# Patient Record
Sex: Female | Born: 1985 | Race: Black or African American | Hispanic: No | Marital: Single | Smoking: Never smoker
Health system: Southern US, Community
[De-identification: ages and names within clinical notes are randomized; demographics above are authoritative.]

## PROBLEM LIST (undated history)

## (undated) ENCOUNTER — Inpatient Hospital Stay (HOSPITAL_COMMUNITY): Payer: Self-pay

## (undated) DIAGNOSIS — D649 Anemia, unspecified: Secondary | ICD-10-CM

## (undated) DIAGNOSIS — L709 Acne, unspecified: Secondary | ICD-10-CM

## (undated) DIAGNOSIS — R519 Headache, unspecified: Secondary | ICD-10-CM

## (undated) DIAGNOSIS — N390 Urinary tract infection, site not specified: Secondary | ICD-10-CM

## (undated) DIAGNOSIS — R011 Cardiac murmur, unspecified: Secondary | ICD-10-CM

## (undated) HISTORY — PX: NO PAST SURGERIES: SHX2092

---

## 2005-12-27 ENCOUNTER — Emergency Department (HOSPITAL_COMMUNITY): Admission: EM | Admit: 2005-12-27 | Discharge: 2005-12-27 | Payer: Self-pay | Admitting: Emergency Medicine

## 2009-06-02 ENCOUNTER — Emergency Department (HOSPITAL_COMMUNITY): Admission: EM | Admit: 2009-06-02 | Discharge: 2009-06-03 | Payer: Self-pay | Admitting: Emergency Medicine

## 2009-06-05 ENCOUNTER — Observation Stay (HOSPITAL_COMMUNITY): Admission: AD | Admit: 2009-06-05 | Discharge: 2009-06-06 | Payer: Self-pay | Admitting: Obstetrics

## 2009-06-05 ENCOUNTER — Encounter: Payer: Self-pay | Admitting: Emergency Medicine

## 2009-06-10 ENCOUNTER — Inpatient Hospital Stay (HOSPITAL_COMMUNITY): Admission: AD | Admit: 2009-06-10 | Discharge: 2009-06-10 | Payer: Self-pay | Admitting: Obstetrics & Gynecology

## 2009-06-10 ENCOUNTER — Ambulatory Visit: Payer: Self-pay | Admitting: Advanced Practice Midwife

## 2010-07-20 LAB — HEPATIC FUNCTION PANEL
ALT: 12 U/L (ref 0–35)
AST: 16 U/L (ref 0–37)
Bilirubin, Direct: 0.1 mg/dL (ref 0.0–0.3)
Total Protein: 6.8 g/dL (ref 6.0–8.3)

## 2010-07-20 LAB — URINALYSIS, ROUTINE W REFLEX MICROSCOPIC
Bilirubin Urine: NEGATIVE
Bilirubin Urine: NEGATIVE
Hgb urine dipstick: NEGATIVE
Ketones, ur: 40 mg/dL — AB
Leukocytes, UA: NEGATIVE
Nitrite: NEGATIVE
Protein, ur: NEGATIVE mg/dL
Protein, ur: NEGATIVE mg/dL
Specific Gravity, Urine: 1.036 — ABNORMAL HIGH (ref 1.005–1.030)
Urobilinogen, UA: 1 mg/dL (ref 0.0–1.0)
Urobilinogen, UA: 1 mg/dL (ref 0.0–1.0)
pH: 7.5 (ref 5.0–8.0)

## 2010-07-20 LAB — POCT I-STAT, CHEM 8
BUN: 10 mg/dL (ref 6–23)
BUN: 6 mg/dL (ref 6–23)
Calcium, Ion: 1.15 mmol/L (ref 1.12–1.32)
Calcium, Ion: 1.16 mmol/L (ref 1.12–1.32)
Chloride: 106 mEq/L (ref 96–112)
Chloride: 107 mEq/L (ref 96–112)
Creatinine, Ser: 0.7 mg/dL (ref 0.4–1.2)
Creatinine, Ser: 0.9 mg/dL (ref 0.4–1.2)
HCT: 38 % (ref 36.0–46.0)
HCT: 45 % (ref 36.0–46.0)
Hemoglobin: 12.9 g/dL (ref 12.0–15.0)
Potassium: 3.7 mEq/L (ref 3.5–5.1)
TCO2: 23 mmol/L (ref 0–100)

## 2010-07-20 LAB — HCG, QUANTITATIVE, PREGNANCY: hCG, Beta Chain, Quant, S: 48148 m[IU]/mL — ABNORMAL HIGH (ref <5)

## 2010-07-20 LAB — URINE MICROSCOPIC-ADD ON

## 2010-07-20 LAB — URINE CULTURE: Colony Count: 25000

## 2010-07-20 LAB — WET PREP, GENITAL: WBC, Wet Prep HPF POC: NONE SEEN

## 2010-07-20 LAB — GC/CHLAMYDIA PROBE AMP, GENITAL
Chlamydia, DNA Probe: NEGATIVE
GC Probe Amp, Genital: NEGATIVE

## 2010-07-21 LAB — URINALYSIS, ROUTINE W REFLEX MICROSCOPIC
Bilirubin Urine: NEGATIVE
Glucose, UA: NEGATIVE mg/dL
Glucose, UA: NEGATIVE mg/dL
Ketones, ur: 15 mg/dL — AB
Nitrite: NEGATIVE
Nitrite: NEGATIVE
Protein, ur: NEGATIVE mg/dL
Urobilinogen, UA: 0.2 mg/dL (ref 0.0–1.0)
pH: 6 (ref 5.0–8.0)
pH: 8 (ref 5.0–8.0)

## 2010-07-21 LAB — COMPREHENSIVE METABOLIC PANEL
AST: 17 U/L (ref 0–37)
Alkaline Phosphatase: 43 U/L (ref 39–117)
BUN: 4 mg/dL — ABNORMAL LOW (ref 6–23)
CO2: 22 mEq/L (ref 19–32)
Calcium: 8.4 mg/dL (ref 8.4–10.5)
Chloride: 109 mEq/L (ref 96–112)
Creatinine, Ser: 0.69 mg/dL (ref 0.4–1.2)
GFR calc Af Amer: >60 mL/min (ref >60)
GFR calc non Af Amer: >60 mL/min (ref >60)
Glucose, Bld: 83 mg/dL (ref 70–99)
Potassium: 3.8 mEq/L (ref 3.5–5.1)

## 2010-07-21 LAB — CBC
HCT: 39.2 % (ref 36.0–46.0)
MCV: 88.4 fL (ref 78.0–100.0)
RBC: 4.44 MIL/uL (ref 3.87–5.11)
RDW: 13.2 % (ref 11.5–15.5)

## 2010-07-21 LAB — RAPID URINE DRUG SCREEN, HOSP PERFORMED
Amphetamines: NOT DETECTED
Cocaine: NOT DETECTED
Tetrahydrocannabinol: NOT DETECTED

## 2010-07-21 LAB — HEPATITIS A ANTIBODY, TOTAL

## 2010-07-21 LAB — DIFFERENTIAL
Basophils Relative: 0 % (ref 0–1)
Lymphocytes Relative: 15 % (ref 12–46)
Neutro Abs: 5.8 10*3/uL (ref 1.7–7.7)

## 2010-09-10 ENCOUNTER — Inpatient Hospital Stay (HOSPITAL_COMMUNITY)
Admission: AD | Admit: 2010-09-10 | Discharge: 2010-09-11 | Disposition: A | Discharge status: Home / Self Care | Payer: Medicaid Other | Source: Ambulatory Visit | Attending: Obstetrics & Gynecology | Admitting: Obstetrics & Gynecology

## 2010-09-10 DIAGNOSIS — O210 Mild hyperemesis gravidarum: Secondary | ICD-10-CM | POA: Insufficient documentation

## 2010-09-10 LAB — CBC
HCT: 40.1 % (ref 36.0–46.0)
Hemoglobin: 13.3 g/dL (ref 12.0–15.0)
MCH: 28.1 pg (ref 26.0–34.0)
Platelets: 287 10*3/uL (ref 150–400)
RBC: 4.73 MIL/uL (ref 3.87–5.11)
RDW: 13.6 % (ref 11.5–15.5)

## 2010-09-10 LAB — URINALYSIS, ROUTINE W REFLEX MICROSCOPIC
Glucose, UA: NEGATIVE mg/dL
Hgb urine dipstick: NEGATIVE
Ketones, ur: 40 mg/dL — AB
Nitrite: NEGATIVE
Protein, ur: NEGATIVE mg/dL
Specific Gravity, Urine: >1.03 — ABNORMAL HIGH (ref 1.005–1.030)
Urobilinogen, UA: 0.2 mg/dL (ref 0.0–1.0)
pH: 6 (ref 5.0–8.0)

## 2010-09-11 LAB — COMPREHENSIVE METABOLIC PANEL
ALT: 14 U/L (ref 0–35)
AST: 18 U/L (ref 0–37)
Albumin: 3.9 g/dL (ref 3.5–5.2)
Alkaline Phosphatase: 65 U/L (ref 39–117)
BUN: 8 mg/dL (ref 6–23)
Calcium: 9.5 mg/dL (ref 8.4–10.5)
Creatinine, Ser: 0.75 mg/dL (ref 0.4–1.2)
Glucose, Bld: 97 mg/dL (ref 70–99)
Potassium: 3.6 mEq/L (ref 3.5–5.1)
Total Bilirubin: 0.3 mg/dL (ref 0.3–1.2)

## 2010-09-14 ENCOUNTER — Inpatient Hospital Stay (HOSPITAL_COMMUNITY): Payer: Medicaid Other

## 2010-09-14 ENCOUNTER — Inpatient Hospital Stay (HOSPITAL_COMMUNITY)
Admission: AD | Admit: 2010-09-14 | Discharge: 2010-09-14 | Disposition: A | Discharge status: Home / Self Care | Payer: Medicaid Other | Source: Ambulatory Visit | Attending: Obstetrics and Gynecology | Admitting: Obstetrics and Gynecology

## 2010-09-14 DIAGNOSIS — A499 Bacterial infection, unspecified: Secondary | ICD-10-CM | POA: Insufficient documentation

## 2010-09-14 DIAGNOSIS — O2390 Unspecified genitourinary tract infection in pregnancy, unspecified trimester: Secondary | ICD-10-CM | POA: Insufficient documentation

## 2010-09-14 DIAGNOSIS — O210 Mild hyperemesis gravidarum: Secondary | ICD-10-CM | POA: Insufficient documentation

## 2010-09-14 DIAGNOSIS — B9689 Other specified bacterial agents as the cause of diseases classified elsewhere: Secondary | ICD-10-CM | POA: Insufficient documentation

## 2010-09-14 DIAGNOSIS — N760 Acute vaginitis: Secondary | ICD-10-CM | POA: Insufficient documentation

## 2010-09-14 LAB — URINALYSIS, ROUTINE W REFLEX MICROSCOPIC
Glucose, UA: NEGATIVE mg/dL
Nitrite: NEGATIVE
Urobilinogen, UA: 2 mg/dL — ABNORMAL HIGH (ref 0.0–1.0)
pH: 6.5 (ref 5.0–8.0)

## 2010-09-14 LAB — CBC
MCV: 84.3 fL (ref 78.0–100.0)
Platelets: 297 10*3/uL (ref 150–400)
RDW: 13.3 % (ref 11.5–15.5)
WBC: 7.4 10*3/uL (ref 4.0–10.5)

## 2010-09-14 LAB — URINE MICROSCOPIC-ADD ON

## 2010-09-14 LAB — COMPREHENSIVE METABOLIC PANEL
ALT: 10 U/L (ref 0–35)
Alkaline Phosphatase: 58 U/L (ref 39–117)
CO2: 22 mEq/L (ref 19–32)
Chloride: 101 mEq/L (ref 96–112)
GFR calc non Af Amer: >60 mL/min (ref >60)
Glucose, Bld: 97 mg/dL (ref 70–99)
Potassium: 3.7 mEq/L (ref 3.5–5.1)
Sodium: 136 mEq/L (ref 135–145)

## 2010-09-14 LAB — HCG, QUANTITATIVE, PREGNANCY: hCG, Beta Chain, Quant, S: 57208 m[IU]/mL — ABNORMAL HIGH (ref <5)

## 2010-09-15 LAB — GC/CHLAMYDIA PROBE AMP, GENITAL: GC Probe Amp, Genital: NEGATIVE

## 2010-09-26 ENCOUNTER — Inpatient Hospital Stay (HOSPITAL_COMMUNITY)
Admission: EM | Admit: 2010-09-26 | Discharge: 2010-09-27 | Disposition: A | Discharge status: Home / Self Care | Payer: Medicaid Other | Source: Ambulatory Visit | Attending: Obstetrics and Gynecology | Admitting: Obstetrics and Gynecology

## 2010-09-26 DIAGNOSIS — O210 Mild hyperemesis gravidarum: Secondary | ICD-10-CM | POA: Insufficient documentation

## 2010-09-27 LAB — URINALYSIS, ROUTINE W REFLEX MICROSCOPIC
Bilirubin Urine: NEGATIVE
Hgb urine dipstick: NEGATIVE
Specific Gravity, Urine: 1.01 (ref 1.005–1.030)
Urobilinogen, UA: 0.2 mg/dL (ref 0.0–1.0)

## 2010-10-06 ENCOUNTER — Inpatient Hospital Stay (HOSPITAL_COMMUNITY)
Admission: AD | Admit: 2010-10-06 | Discharge: 2010-10-07 | Disposition: A | Discharge status: Home / Self Care | Payer: Medicaid Other | Source: Ambulatory Visit | Attending: Obstetrics & Gynecology | Admitting: Obstetrics & Gynecology

## 2010-10-06 DIAGNOSIS — O210 Mild hyperemesis gravidarum: Secondary | ICD-10-CM

## 2010-10-06 LAB — URINALYSIS, ROUTINE W REFLEX MICROSCOPIC
Glucose, UA: NEGATIVE mg/dL
Hgb urine dipstick: NEGATIVE
Protein, ur: NEGATIVE mg/dL
Specific Gravity, Urine: 1.015 (ref 1.005–1.030)
pH: 7.5 (ref 5.0–8.0)

## 2010-10-11 ENCOUNTER — Inpatient Hospital Stay (HOSPITAL_COMMUNITY)
Admission: AD | Admit: 2010-10-11 | Discharge: 2010-10-12 | Disposition: A | Discharge status: Home / Self Care | Payer: Medicaid Other | Source: Ambulatory Visit | Attending: Obstetrics & Gynecology | Admitting: Obstetrics & Gynecology

## 2010-10-11 DIAGNOSIS — O210 Mild hyperemesis gravidarum: Secondary | ICD-10-CM | POA: Insufficient documentation

## 2010-10-11 LAB — URINE MICROSCOPIC-ADD ON

## 2010-10-11 LAB — URINALYSIS, ROUTINE W REFLEX MICROSCOPIC
Glucose, UA: NEGATIVE mg/dL
Leukocytes, UA: NEGATIVE
Specific Gravity, Urine: >1.03 — ABNORMAL HIGH (ref 1.005–1.030)
pH: 6 (ref 5.0–8.0)

## 2010-10-18 ENCOUNTER — Other Ambulatory Visit (HOSPITAL_COMMUNITY): Payer: Self-pay | Admitting: Maternal and Fetal Medicine

## 2010-10-18 ENCOUNTER — Other Ambulatory Visit (HOSPITAL_COMMUNITY): Payer: Self-pay | Admitting: Family Medicine

## 2010-10-18 DIAGNOSIS — Z369 Encounter for antenatal screening, unspecified: Secondary | ICD-10-CM

## 2010-10-24 LAB — HIV ANTIBODY (ROUTINE TESTING W REFLEX): HIV: NONREACTIVE

## 2010-10-28 ENCOUNTER — Inpatient Hospital Stay (HOSPITAL_COMMUNITY)
Admission: AD | Admit: 2010-10-28 | Discharge: 2010-10-28 | Disposition: A | Discharge status: Home / Self Care | Payer: Medicaid Other | Source: Ambulatory Visit | Attending: Family Medicine | Admitting: Family Medicine

## 2010-10-28 DIAGNOSIS — O210 Mild hyperemesis gravidarum: Secondary | ICD-10-CM

## 2010-10-28 LAB — CBC
HCT: 37.3 % (ref 36.0–46.0)
MCV: 83.6 fL (ref 78.0–100.0)
Platelets: 310 10*3/uL (ref 150–400)
RBC: 4.46 MIL/uL (ref 3.87–5.11)
WBC: 10.4 10*3/uL (ref 4.0–10.5)

## 2010-10-28 LAB — URINALYSIS, ROUTINE W REFLEX MICROSCOPIC
Bilirubin Urine: NEGATIVE
Glucose, UA: NEGATIVE mg/dL
Hgb urine dipstick: NEGATIVE
Protein, ur: 30 mg/dL — AB
Urobilinogen, UA: 0.2 mg/dL (ref 0.0–1.0)

## 2010-10-28 LAB — BASIC METABOLIC PANEL
BUN: 8 mg/dL (ref 6–23)
CO2: 21 mEq/L (ref 19–32)
Chloride: 100 mEq/L (ref 96–112)
Creatinine, Ser: 0.55 mg/dL (ref 0.50–1.10)
GFR calc Af Amer: >60 mL/min (ref >60)
Potassium: 3.7 mEq/L (ref 3.5–5.1)

## 2010-10-28 LAB — URINE MICROSCOPIC-ADD ON

## 2010-12-14 ENCOUNTER — Ambulatory Visit (HOSPITAL_COMMUNITY)
Admission: RE | Admit: 2010-12-14 | Discharge: 2010-12-14 | Disposition: A | Discharge status: Home / Self Care | Payer: Medicaid Other | Source: Ambulatory Visit | Attending: Obstetrics and Gynecology | Admitting: Obstetrics and Gynecology

## 2010-12-14 ENCOUNTER — Encounter (HOSPITAL_COMMUNITY): Payer: Self-pay

## 2010-12-14 NOTE — Progress Notes (Signed)
Genetic Counseling  High-Risk Gestation Note  Appointment Date:  12/14/2010 Referred By: Fortino Sic, MD Date of Birth:  October 04, 1985 Partner:  Drucie Ip    Pregnancy History: [id number] Estimated Date of Delivery: 05/16/10 Estimated Gestational Age: 26.6 weeks  I met with Ms. Towery today for genetic counseling regarding her hemoglobin electrophoresis results.  Ms. Brucks was offered hemoglobin electrophoresis through her primary obstetrician's office for screening of hemoglobinopathies, because of her African-American ancestry.  Her result showed levels of hemoglobin A and hemoglobin A2, which were just outside of the normal range.  For hemoglobin A, the normal range is 96.8-97.8% and Ms. Holdsworth's percentage was 96.7.  In addition, her hemoglobin A2 was 3.3% as compared to the normal range of 2.2-3.2%.  We discussed that elevated A2 levels can be seen in beta thalassemia trait as well as a variety of other unstable hemoglobin variants; however, the range of hemoglobin A2 in carriers of beta thalassemia trait is usually 4-9% and these individuals typically have a low level of hemoglobin F, 1-5%.  Ms. Phegley hemoglobin electrophoresis result showed 0% hemoglobin F.  No other hemoglobin variants (Hb S, Hb C etc) were apparent by her test result.  We also discussed the results of Ms. Malizia's CBC.  Specifically, we discussed that her RBC values were within normal limits.  In this scenario, it is unlikely that this mild/borderline elevation of hemoglobin A2 is of any clinical significance.    Ms. Berthelot was counseled that the thalassemias are the most common human single-gene disorders affecting hemoglobin synthesis.  Mutations in these genes reduce the synthesis or stability of either the alpha globin or beta globin chain to cause alpha thalassemia or beta thalassemia, respectively.  The resulting imbalance in the ratio of the alpha chains to the beta chains underlies the pathophysiological  process.  The chain produced at the normal rate is in relative excess.  In the absence of the complementary chain with which to form a tetramer, the excess normal chains eventually precipitate in the cell, damaging the cell membrane and leading to premature red blood cell destruction (anemia).  We reviewed the autosomal recessive inheritance of beta thalassemia and the associated risks.  The FOB, Mr. Drucie Ip, had hemoglobin electrophoresis and his results were within normal limits.  Given this result, Ms. Nordin was counseled that the fetus is not expected to have an increased risk for a hemoglobinopathy.  We reviewed that hemoglobinopathies are routinely screened for as part of the [place] newborn screening panel.  Ms. Frick understands that while it is most likely that her result is not clinically significant, further evaluation could help to clarify this result.  We reviewed the options of molecular genetic testing of the beta globin gene to determine if there is a specific gene alteration as well as the option of repeating the hemoglobin electrophoresis.  She declined these options today.  Both family histories were reviewed and found to be noncontributory for birth defects, mental retardation, and known genetic conditions.  Without further information regarding the provided family history, an accurate genetic risk cannot be calculated.   Further genetic counseling is warranted if more information is obtained.  The patient denied exposure to environmental toxins or chemical agents.  She denied the use of alcohol, tobacco or street drugs.  She denied significant viral illnesses during the course of her pregnancy.  Her medical and surgical history were noncontributory.   I counseled the patient for approximately 35 minutes regarding the above risks and available  options.     Duffy Rhody, Florencia Zaccaro 12/14/2010

## 2011-04-12 LAB — STREP B DNA PROBE: GBS: NEGATIVE

## 2011-05-09 ENCOUNTER — Encounter (HOSPITAL_COMMUNITY): Payer: Self-pay | Admitting: Anesthesiology

## 2011-05-09 ENCOUNTER — Inpatient Hospital Stay (HOSPITAL_COMMUNITY)
Admission: RE | Admit: 2011-05-09 | Discharge: 2011-05-12 | DRG: 775 | Disposition: A | Discharge status: Home / Self Care | Payer: Medicaid Other | Source: Ambulatory Visit | Attending: Obstetrics and Gynecology | Admitting: Obstetrics and Gynecology

## 2011-05-09 ENCOUNTER — Inpatient Hospital Stay (HOSPITAL_COMMUNITY): Payer: Medicaid Other | Admitting: Anesthesiology

## 2011-05-09 ENCOUNTER — Encounter (HOSPITAL_COMMUNITY): Payer: Self-pay

## 2011-05-09 ENCOUNTER — Other Ambulatory Visit (HOSPITAL_COMMUNITY): Payer: Self-pay | Admitting: Obstetrics and Gynecology

## 2011-05-09 DIAGNOSIS — O480 Post-term pregnancy: Secondary | ICD-10-CM | POA: Diagnosis present

## 2011-05-09 DIAGNOSIS — D649 Anemia, unspecified: Secondary | ICD-10-CM | POA: Diagnosis not present

## 2011-05-09 DIAGNOSIS — O9903 Anemia complicating the puerperium: Secondary | ICD-10-CM | POA: Diagnosis not present

## 2011-05-09 DIAGNOSIS — O139 Gestational [pregnancy-induced] hypertension without significant proteinuria, unspecified trimester: Principal | ICD-10-CM | POA: Diagnosis present

## 2011-05-09 HISTORY — DX: Cardiac murmur, unspecified: R01.1

## 2011-05-09 HISTORY — DX: Anemia, unspecified: D64.9

## 2011-05-09 LAB — CBC
HCT: 29.9 % — ABNORMAL LOW (ref 36.0–46.0)
MCH: 26.6 pg (ref 26.0–34.0)
MCHC: 32.4 g/dL (ref 30.0–36.0)
MCV: 82.1 fL (ref 78.0–100.0)
Platelets: 261 10*3/uL (ref 150–400)
RDW: 14.2 % (ref 11.5–15.5)

## 2011-05-09 MED ORDER — FENTANYL 2.5 MCG/ML BUPIVACAINE 1/10 % EPIDURAL INFUSION (WH - ANES)
14.0000 mL/h | INTRAMUSCULAR | Status: DC
  Administered 2011-05-09 – 2011-05-10 (×6): 14 mL/h via EPIDURAL
  Filled 2011-05-09 (×8): qty 60

## 2011-05-09 MED ORDER — OXYTOCIN BOLUS FROM INFUSION
500.0000 mL | Freq: Once | INTRAVENOUS | Status: DC
  Filled 2011-05-09: qty 500

## 2011-05-09 MED ORDER — FENTANYL 2.5 MCG/ML BUPIVACAINE 1/10 % EPIDURAL INFUSION (WH - ANES)
INTRAMUSCULAR | Status: DC | PRN
  Administered 2011-05-09: 14 mL/h via EPIDURAL

## 2011-05-09 MED ORDER — IBUPROFEN 600 MG PO TABS: 600.0000 mg | ORAL_TABLET | Freq: Four times a day (QID) | ORAL | Status: DC | PRN

## 2011-05-09 MED ORDER — CITRIC ACID-SODIUM CITRATE 334-500 MG/5ML PO SOLN: 30.0000 mL | ORAL | Status: DC | PRN

## 2011-05-09 MED ORDER — TERBUTALINE SULFATE 1 MG/ML IJ SOLN: 0.2500 mg | Freq: Once | INTRAMUSCULAR | Status: AC | PRN

## 2011-05-09 MED ORDER — OXYTOCIN 20 UNITS IN LACTATED RINGERS INFUSION - SIMPLE
125.0000 mL/h | INTRAVENOUS | Status: DC
  Administered 2011-05-09: 2 m[IU]/min via INTRAVENOUS
  Administered 2011-05-09: 8 m[IU]/min via INTRAVENOUS
  Administered 2011-05-10: 125 mL/h via INTRAVENOUS
  Filled 2011-05-09: qty 1000

## 2011-05-09 MED ORDER — EPHEDRINE 5 MG/ML INJ
10.0000 mg | INTRAVENOUS | Status: DC | PRN
  Filled 2011-05-09: qty 4

## 2011-05-09 MED ORDER — LIDOCAINE HCL (PF) 1 % IJ SOLN
30.0000 mL | INTRAMUSCULAR | Status: DC | PRN
  Filled 2011-05-09: qty 30

## 2011-05-09 MED ORDER — DIPHENHYDRAMINE HCL 50 MG/ML IJ SOLN: 12.5000 mg | INTRAMUSCULAR | Status: DC | PRN

## 2011-05-09 MED ORDER — OXYTOCIN 20 UNITS IN LACTATED RINGERS INFUSION - SIMPLE
125.0000 mL/h | Freq: Once | INTRAVENOUS | Status: DC
  Filled 2011-05-09: qty 1000

## 2011-05-09 MED ORDER — ONDANSETRON HCL 4 MG/2ML IJ SOLN
4.0000 mg | Freq: Four times a day (QID) | INTRAMUSCULAR | Status: DC | PRN
  Administered 2011-05-09: 4 mg via INTRAVENOUS
  Filled 2011-05-09: qty 2

## 2011-05-09 MED ORDER — LACTATED RINGERS IV SOLN
INTRAVENOUS | Status: DC
  Administered 2011-05-09 – 2011-05-10 (×4): via INTRAVENOUS
  Administered 2011-05-10: 300 mL via INTRAVENOUS
  Administered 2011-05-10: 11:00:00 via INTRAVENOUS

## 2011-05-09 MED ORDER — PHENYLEPHRINE 40 MCG/ML (10ML) SYRINGE FOR IV PUSH (FOR BLOOD PRESSURE SUPPORT): 80.0000 ug | PREFILLED_SYRINGE | INTRAVENOUS | Status: DC | PRN

## 2011-05-09 MED ORDER — FLEET ENEMA 7-19 GM/118ML RE ENEM: 1.0000 | ENEMA | RECTAL | Status: DC | PRN

## 2011-05-09 MED ORDER — EPHEDRINE 5 MG/ML INJ: 10.0000 mg | INTRAVENOUS | Status: DC | PRN

## 2011-05-09 MED ORDER — LIDOCAINE HCL 1.5 % IJ SOLN
INTRAMUSCULAR | Status: DC | PRN
  Administered 2011-05-09 (×2): 4 mL via EPIDURAL

## 2011-05-09 MED ORDER — ACETAMINOPHEN 325 MG PO TABS
650.0000 mg | ORAL_TABLET | ORAL | Status: DC | PRN
  Administered 2011-05-10: 650 mg via ORAL
  Filled 2011-05-09: qty 2

## 2011-05-09 MED ORDER — OXYCODONE-ACETAMINOPHEN 5-325 MG PO TABS: 2.0000 | ORAL_TABLET | ORAL | Status: DC | PRN

## 2011-05-09 MED ORDER — LACTATED RINGERS IV SOLN: 500.0000 mL | Freq: Once | INTRAVENOUS | Status: DC

## 2011-05-09 MED ORDER — LACTATED RINGERS IV SOLN
500.0000 mL | INTRAVENOUS | Status: DC | PRN
  Administered 2011-05-09: 300 mL via INTRAVENOUS
  Administered 2011-05-09: 1000 mL via INTRAVENOUS

## 2011-05-09 MED ORDER — PHENYLEPHRINE 40 MCG/ML (10ML) SYRINGE FOR IV PUSH (FOR BLOOD PRESSURE SUPPORT)
80.0000 ug | PREFILLED_SYRINGE | INTRAVENOUS | Status: DC | PRN
  Filled 2011-05-09: qty 5

## 2011-05-09 NOTE — Anesthesia Preprocedure Evaluation (Signed)
Anesthesia Evaluation  Patient identified by MRN, date of birth, ID band Patient awake    Reviewed: Allergy & Precautions, H&P , Patient's Chart, lab work & pertinent test results  Airway Mallampati: III TM Distance: >3 FB Neck ROM: full    Dental No notable dental hx. (+) Teeth Intact   Pulmonary neg pulmonary ROS,  clear to auscultation  Pulmonary exam normal       Cardiovascular neg cardio ROS regular Normal    Neuro/Psych Negative Neurological ROS  Negative Psych ROS   GI/Hepatic negative GI ROS, Neg liver ROS,   Endo/Other  Negative Endocrine ROS  Renal/GU negative Renal ROS  Genitourinary negative   Musculoskeletal   Abdominal Normal abdominal exam  (+)   Peds  Hematology negative hematology ROS (+)   Anesthesia Other Findings   Reproductive/Obstetrics (+) Pregnancy                           Anesthesia Physical Anesthesia Plan  ASA: II  Anesthesia Plan: Epidural   Post-op Pain Management:    Induction:   Airway Management Planned:   Additional Equipment:   Intra-op Plan:   Post-operative Plan:   Informed Consent: I have reviewed the patients History and Physical, chart, labs and discussed the procedure including the risks, benefits and alternatives for the proposed anesthesia with the patient or authorized representative who has indicated his/her understanding and acceptance.     Plan Discussed with: Anesthesiologist and Surgeon  Anesthesia Plan Comments:         Anesthesia Quick Evaluation  

## 2011-05-09 NOTE — Progress Notes (Signed)
Dr. Neva Seat called and notified of vaginal exam and uterine activity.  No new orders received.

## 2011-05-09 NOTE — Anesthesia Procedure Notes (Signed)
Epidural Patient location during procedure: OB Start time: 05/09/2011 2:13 PM  Staffing Anesthesiologist: Jonasia Coiner A.  Preanesthetic Checklist Completed: patient identified, site marked, surgical consent, pre-op evaluation, timeout performed, IV checked, risks and benefits discussed and monitors and equipment checked  Epidural Patient position: sitting Prep: site prepped and draped and DuraPrep Patient monitoring: continuous pulse ox and blood pressure Approach: midline Injection technique: LOR air  Needle:  Needle type: Tuohy  Needle gauge: 17 G Needle length: 9 cm Needle insertion depth: 5 cm cm Catheter type: closed end flexible Catheter size: 19 Gauge Catheter at skin depth: 10 cm Test dose: negative and 1.5% lidocaine  Assessment Events: blood not aspirated, injection not painful, no injection resistance, negative IV test and no paresthesia  Additional Notes Patient is more comfortable after epidural dosed. Please see RN's note for documentation of vital signs and FHR which are stable.

## 2011-05-09 NOTE — H&P (Signed)
Carolyn Case is an 26 y.o. female. G3P0 A 2 whose due date was January 4th.  Prenatal course has been uncomplicated.  The patient is past her due date and requests induction.  She complains of nausea and vomiting and blood pressure is slightly elevated at 136/84 and a trace of proteinuria is noted, consistent with the development of mild preeclampsia.  No visual disturbances or epigastric pain is reported. Risks, possible complications of induction including possible Cesarean section has been discussed with the patient and her partner and informed consent has been given for induction.  Pertinent Gynecological History: Menses: none Bleeding: no Contraception: none DES exposure: denies Blood transfusions: none Sexually transmitted diseases: no past history Previous GYN Procedures: elective abortion x2  Last mammogram: na Date: na Last pap: normal Date: June 2012 OB History: G3, P0   Menstrual History: Menarche age: 17 LMP March 30,2012    No past medical history on file.  Operations:  Elective ab x2  No family history on file.  Social History:  does not have a smoking history on file. She does not have any smokeless tobacco history on file. Her alcohol and drug histories not on file.  Allergies: NKDA   (Not in a hospital admission)  Review of Systems  Constitutional: Negative.   HENT: Negative for hearing loss, ear pain, nosebleeds, congestion, sore throat, tinnitus and ear discharge.        Mild  Eyes: Negative.   Respiratory: Negative.  Negative for stridor.   Gastrointestinal: Negative.   Genitourinary: Negative.   Musculoskeletal: Negative.   Skin: Negative.   Neurological: Positive for headaches. Negative for dizziness, tingling, tremors, sensory change, speech change, focal weakness and seizures.  Endo/Heme/Allergies: Negative.   Psychiatric/Behavioral: Negative.     There were no vitals taken for this visit. Physical Exam  Constitutional: She is oriented to  person, place, and time. She appears well-developed and well-nourished. No distress.  HENT:  Head: Normocephalic and atraumatic.  Eyes: Conjunctivae and EOM are normal. Right eye exhibits no discharge. Left eye exhibits no discharge. No scleral icterus.  Neck: Normal range of motion. Neck supple. No JVD present. No tracheal deviation present. No thyromegaly present.  Cardiovascular: Normal rate, regular rhythm, normal heart sounds and intact distal pulses.  Exam reveals no gallop and no friction rub.   No murmur heard. Respiratory: Effort normal and breath sounds normal. No respiratory distress. She has no wheezes. She has no rales. She exhibits no tenderness.  GI: Soft. Bowel sounds are normal. There is no tenderness. There is no rebound and no guarding.  Genitourinary: Vagina normal and uterus normal. Guaiac negative stool. No vaginal discharge found.       Term gravida  Musculoskeletal: Normal range of motion. She exhibits no edema and no tenderness.  Lymphadenopathy:    She has no cervical adenopathy.  Neurological: She is alert and oriented to person, place, and time. She displays normal reflexes. She exhibits normal muscle tone. Coordination normal.  Skin: Skin is warm and dry. No rash noted. She is not diaphoretic. No erythema. No pallor.  Psychiatric: She has a normal mood and affect. Her behavior is normal. Judgment and thought content normal.    No results found for this or any previous visit (from the past 24 hour(s)).  No results found.  Assessment/Plan: 40 4/7 week pregnancy, mild PIH P:  Pitocin induction  Daliya Parchment E 05/09/2011, 12:17 AM

## 2011-05-09 NOTE — Progress Notes (Signed)
Pt not wanting to change position since she was sleeping. Encouraged to change position soon.

## 2011-05-10 ENCOUNTER — Encounter (HOSPITAL_COMMUNITY): Payer: Self-pay

## 2011-05-10 LAB — CBC
HCT: 24.8 % — ABNORMAL LOW (ref 36.0–46.0)
MCHC: 32.7 g/dL (ref 30.0–36.0)
Platelets: 197 10*3/uL (ref 150–400)
RDW: 14.3 % (ref 11.5–15.5)
WBC: 21.4 10*3/uL — ABNORMAL HIGH (ref 4.0–10.5)

## 2011-05-10 LAB — COMPREHENSIVE METABOLIC PANEL
ALT: 6 U/L (ref 0–35)
AST: 21 U/L (ref 0–37)
Albumin: 1.9 g/dL — ABNORMAL LOW (ref 3.5–5.2)
Alkaline Phosphatase: 89 U/L (ref 39–117)
BUN: 7 mg/dL (ref 6–23)
Chloride: 106 mEq/L (ref 96–112)
Potassium: 3.3 mEq/L — ABNORMAL LOW (ref 3.5–5.1)
Sodium: 134 mEq/L — ABNORMAL LOW (ref 135–145)
Total Bilirubin: 0.7 mg/dL (ref 0.3–1.2)
Total Protein: 4.9 g/dL — ABNORMAL LOW (ref 6.0–8.3)

## 2011-05-10 LAB — URIC ACID: Uric Acid, Serum: 7.2 mg/dL — ABNORMAL HIGH (ref 2.4–7.0)

## 2011-05-10 MED ORDER — ZOLPIDEM TARTRATE 5 MG PO TABS: 5.0000 mg | ORAL_TABLET | Freq: Every evening | ORAL | Status: DC | PRN

## 2011-05-10 MED ORDER — ONDANSETRON HCL 4 MG/2ML IJ SOLN: 4.0000 mg | INTRAMUSCULAR | Status: DC | PRN

## 2011-05-10 MED ORDER — TETANUS-DIPHTH-ACELL PERTUSSIS 5-2.5-18.5 LF-MCG/0.5 IM SUSP
0.5000 mL | Freq: Once | INTRAMUSCULAR | Status: AC
  Administered 2011-05-11: 0.5 mL via INTRAMUSCULAR
  Filled 2011-05-10: qty 0.5

## 2011-05-10 MED ORDER — SIMETHICONE 80 MG PO CHEW: 80.0000 mg | CHEWABLE_TABLET | ORAL | Status: DC | PRN

## 2011-05-10 MED ORDER — BENZOCAINE-MENTHOL 20-0.5 % EX AERO
INHALATION_SPRAY | CUTANEOUS | Status: AC
  Administered 2011-05-10: 1 via TOPICAL
  Filled 2011-05-10: qty 56

## 2011-05-10 MED ORDER — MEDROXYPROGESTERONE ACETATE 150 MG/ML IM SUSP: 150.0000 mg | INTRAMUSCULAR | Status: DC | PRN

## 2011-05-10 MED ORDER — LANOLIN HYDROUS EX OINT: TOPICAL_OINTMENT | CUTANEOUS | Status: DC | PRN

## 2011-05-10 MED ORDER — DIBUCAINE 1 % RE OINT
1.0000 "application " | TOPICAL_OINTMENT | RECTAL | Status: DC | PRN
  Administered 2011-05-10: 1 via RECTAL
  Filled 2011-05-10: qty 28

## 2011-05-10 MED ORDER — OXYCODONE-ACETAMINOPHEN 5-325 MG PO TABS
1.0000 | ORAL_TABLET | ORAL | Status: DC | PRN
  Administered 2011-05-10: 1 via ORAL
  Administered 2011-05-10: 2 via ORAL
  Administered 2011-05-11: 1 via ORAL
  Administered 2011-05-11 – 2011-05-12 (×4): 2 via ORAL
  Filled 2011-05-10 (×5): qty 2
  Filled 2011-05-10 (×2): qty 1

## 2011-05-10 MED ORDER — IBUPROFEN 600 MG PO TABS
600.0000 mg | ORAL_TABLET | Freq: Four times a day (QID) | ORAL | Status: DC
  Administered 2011-05-10 – 2011-05-12 (×7): 600 mg via ORAL
  Filled 2011-05-10 (×6): qty 1

## 2011-05-10 MED ORDER — BENZOCAINE-MENTHOL 20-0.5 % EX AERO
1.0000 "application " | INHALATION_SPRAY | CUTANEOUS | Status: DC | PRN
  Administered 2011-05-10: 1 via TOPICAL

## 2011-05-10 MED ORDER — WITCH HAZEL-GLYCERIN EX PADS
1.0000 "application " | MEDICATED_PAD | CUTANEOUS | Status: DC | PRN
  Administered 2011-05-10: 1 via TOPICAL

## 2011-05-10 MED ORDER — FERROUS SULFATE 325 (65 FE) MG PO TABS
325.0000 mg | ORAL_TABLET | Freq: Two times a day (BID) | ORAL | Status: DC
  Administered 2011-05-11 – 2011-05-12 (×3): 325 mg via ORAL
  Filled 2011-05-10 (×3): qty 1

## 2011-05-10 MED ORDER — ONDANSETRON HCL 4 MG PO TABS: 4.0000 mg | ORAL_TABLET | ORAL | Status: DC | PRN

## 2011-05-10 MED ORDER — DIPHENHYDRAMINE HCL 25 MG PO CAPS: 25.0000 mg | ORAL_CAPSULE | Freq: Four times a day (QID) | ORAL | Status: DC | PRN

## 2011-05-10 MED ORDER — PRENATAL MULTIVITAMIN CH
1.0000 | ORAL_TABLET | Freq: Every day | ORAL | Status: DC
  Administered 2011-05-11 – 2011-05-12 (×2): 1 via ORAL
  Filled 2011-05-10 (×2): qty 1

## 2011-05-10 MED ORDER — MEASLES, MUMPS & RUBELLA VAC ~~LOC~~ INJ
0.5000 mL | INJECTION | Freq: Once | SUBCUTANEOUS | Status: DC
  Filled 2011-05-10: qty 0.5

## 2011-05-10 MED ORDER — SENNOSIDES-DOCUSATE SODIUM 8.6-50 MG PO TABS
2.0000 | ORAL_TABLET | Freq: Every day | ORAL | Status: DC
  Administered 2011-05-11 (×2): 2 via ORAL

## 2011-05-10 NOTE — Progress Notes (Addendum)
Carolyn Case is a 26 y.o. G3P0020 at [redacted]w[redacted]d  admitted for induction of labor due to mild PIH and past due date.  Subjective:  No complaints   Objective: BP 128/83  Pulse 79  Temp(Src) 98.4 F (36.9 C) (Oral)  Resp 18  Ht 5\' 4"  (1.626 m)  Wt 181 lb (82.101 kg)  BMI 31.07 kg/m2  SpO2 100% I/O last 3 completed shifts: In: 591.2 [I.V.:591.2] Out: 2400 [Urine:2400] Total I/O In: 15 [I.V.:15] Out: 450 [Urine:450]  FHT:  120s, moderate variability, decelerations absent  Category 1 tracing  UC:   regular, every 3 to 4 minutes SVE:   Dilation: 6 Effacement (%): 90 Station: -1 Exam by:: Ahnaf Caponi Some caput and molding noted.  Occiput posterior. With contraction, cervix  is 7-8/C/-1.  Labs: Lab Results  Component Value Date   WBC 7.5 05/09/2011   HGB 9.7* 05/09/2011   HCT 29.9* 05/09/2011   MCV 82.1 05/09/2011   PLT 261 05/09/2011    Assessment / Plan: Induction of labor due to preeclampsia and postterm,  progressing well on pitocin  Labor: progressing well now. Fetal Wellbeing:  Category I Pain Control:  Epidural Anticipated MOD:  NSVD if continued progress.  Kensy Blizard E 05/10/2011, 9:47 AM

## 2011-05-10 NOTE — Progress Notes (Signed)
Dr. Neva Seat called L&D to check patient status.  Advised of most recent SVE and UC's.  No new orders received at this time.

## 2011-05-10 NOTE — Progress Notes (Signed)
Delivery Note At 1:13 PM a viable and healthy female was delivered via Vaginal, Vacuum Investment banker, operational) (Presentation:Straight Occiput  Posterior).  APGAR: 8, 9; weight 8 lb 3.6 oz (3731 g).   Placenta status: Intact, Spontaneous.  Cord: 3 vessels with the following complications: .  Cord pH:na  Anesthesia: Epidural  Episiotomy: Median Lacerations:  Suture Repair: 2.0 chromic vicryl 0 Est. Blood Loss (mL): 500  Mom to postpartum.  Baby to nursery-stable.  Yossef Gilkison E 05/10/2011, 1:58 PM

## 2011-05-11 NOTE — Anesthesia Postprocedure Evaluation (Signed)
  Anesthesia Post-op Note  Patient: Scientist, clinical (histocompatibility and immunogenetics)  Procedure(s) Performed: * No procedures listed *  Patient Location: Mother/Baby  Anesthesia Type: Epidural  Level of Consciousness: awake, alert  and oriented  Airway and Oxygen Therapy: Patient Spontanous Breathing  Post-op Pain: none  Post-op Assessment: Post-op Vital signs reviewed, Patient's Cardiovascular Status Stable, No headache, No backache, No residual numbness and No residual motor weakness  Post-op Vital Signs: Reviewed and stable  Complications: No apparent anesthesia complications

## 2011-05-11 NOTE — Progress Notes (Signed)
Post Partum Day 1 Subjective: no complaints, tolerating PO and had BM  Objective: Blood pressure 119/79, pulse 82, temperature 98.2 F (36.8 C), temperature source Oral, resp. rate 18, height 5\' 4"  (1.626 m), weight 181 lb (82.101 kg), SpO2 100.00%, unknown if currently breastfeeding.  Physical Exam:  General: alert, cooperative and no distress Lochia: appropriate Uterine Fundus: firm Episiotomy, laceration : no significant drainage DVT Evaluation: No evidence of DVT seen on physical exam.   Basename 05/10/11 1645 05/09/11 0655  HGB 8.1* 9.7*  HCT 24.8* 29.9*    Assessment/Plan: Plan for discharge tomorrow Anemia  P: iron   LOS: 2 days   Omeed Osuna E 05/11/2011, 5:06 PM

## 2011-05-11 NOTE — Progress Notes (Signed)
UR chart review completed.  

## 2011-05-25 NOTE — Discharge Summary (Signed)
Obstetric Discharge Summary Reason for Admission: induction of labor, Mild PIH Prenatal Procedures: none Intrapartum Procedures: spontaneous vaginal delivery Postpartum Procedures: none Complications-Operative and Postpartum: none  Hemoglobin  Date Value Range Status  05/10/2011 8.1* 12.0-15.0 (g/dL) Final     HCT  Date Value Range Status  05/10/2011 24.8* 36.0-46.0 (%) Final    Discharge Diagnoses: Term Pregnancy-delivered  Discharge Information: Date: 05/25/2011 Activity: pelvic rest Diet: routine Medications: Colace, Iron and Percocet Condition: stable Instructions: refer to practice specific booklet Discharge to: home   Newborn Data: Live born  Information for the patient's newborn:  Jackelyn Hoehn [[id number]]  female ; APGAR , ; weight ;  Home with mother.  Dare Sanger E 05/25/2011, 5:10 AM

## 2011-05-25 NOTE — Progress Notes (Signed)
Post Partum Day 2 Subjective: no complaints  Objective: Blood pressure 133/85, pulse 88, temperature 98.3 F (36.8 C), temperature source Oral, resp. rate 18, height 5\' 4"  (1.626 m), weight 181 lb (82.101 kg), SpO2 100.00%, unknown if currently breastfeeding.  Physical Exam:  General: alert and cooperative Lochia: appropriate Uterine Fundus: firm Episiotomy, laceration : no significant drainage DVT Evaluation: No evidence of DVT seen on physical exam.  No results found for this basename: HGB:2,HCT:2 in the last 72 hours  Assessment/Plan: Discharge home   LOS: 3 days   Carolyn Case 05/25/2011, 5:08 AM

## 2012-03-09 IMAGING — US US OB COMP LESS 14 WK
1 series · 14 of 28 positions shown · non-contrast
Comparison: None.

CLINICAL DATA: Pelvic pain.  Vaginal bleeding.  6-week-1-day
gestational age by LMP.

OBSTETRIC <14 WK US AND TRANSVAGINAL OB US
TECHNIQUE: Both transabdominal and transvaginal ultrasound
examinations were performed for complete evaluation of the
gestation as well as the maternal uterus, adnexal regions, and
pelvic cul-de-sac.  Transvaginal technique was performed to assess
early pregnancy.

[Series 1: us ob comp less 14 wks · 14 of 46 slices shown]
[im 2/46]
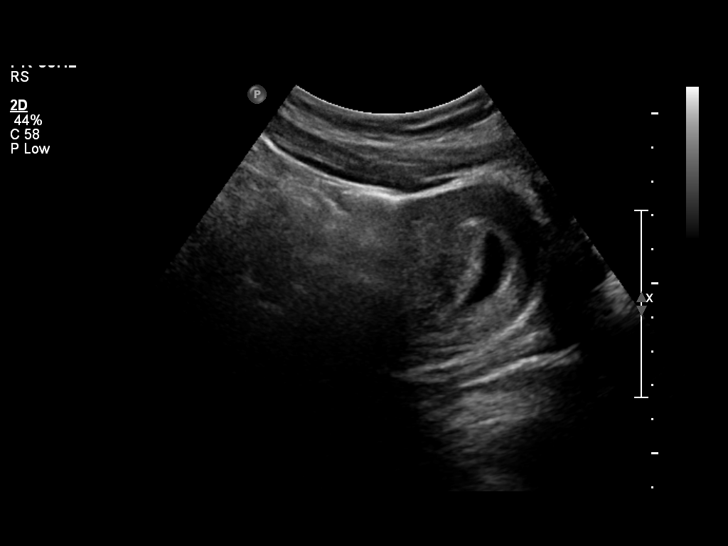
[im 6/46]
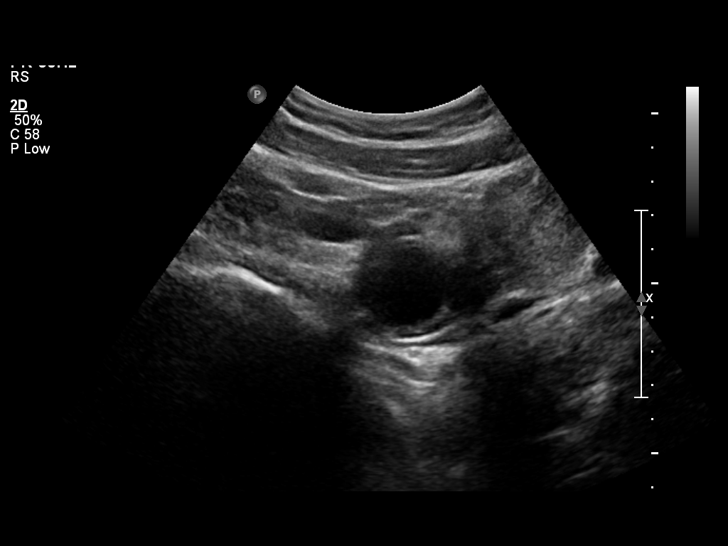
[im 9/46]
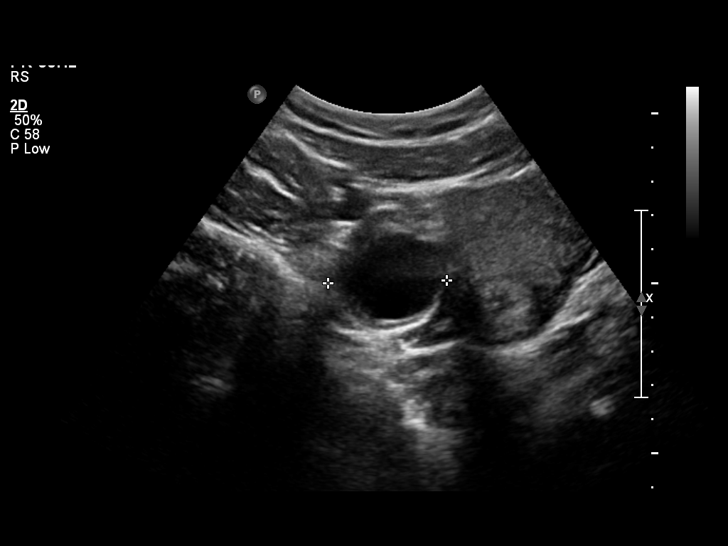
[im 12/46]
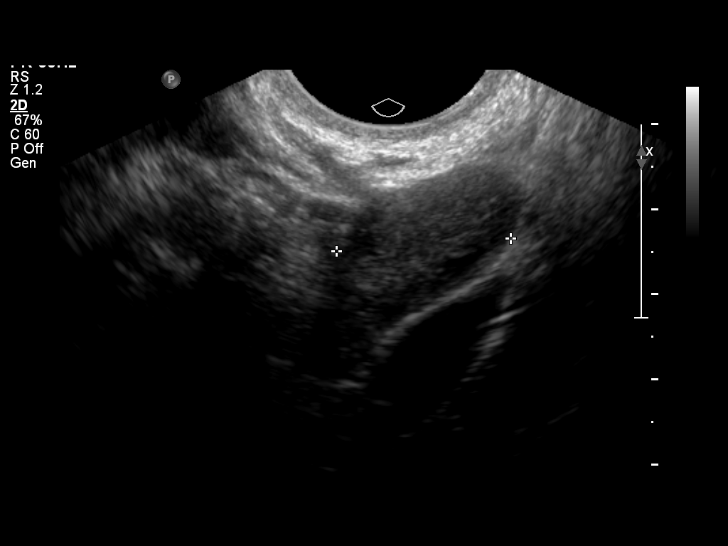
[im 16/46]
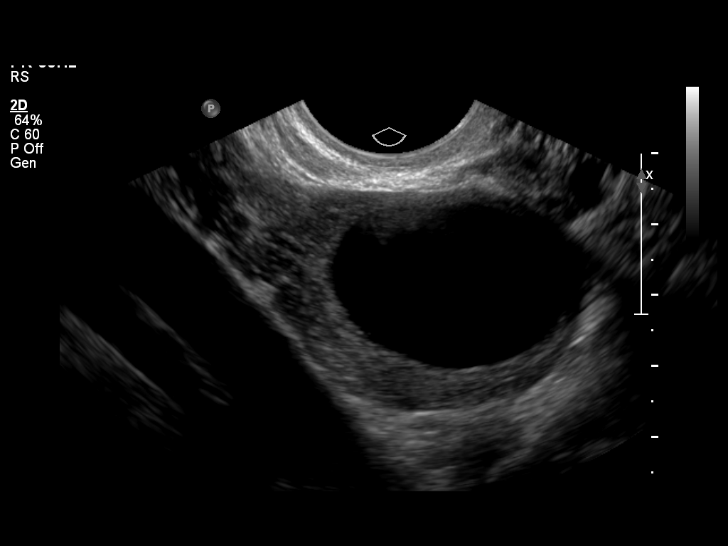
[im 19/46]
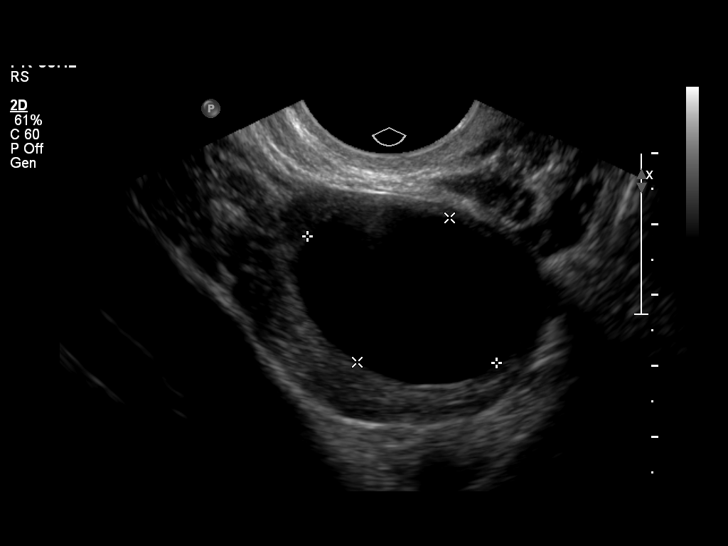
[im 22/46]
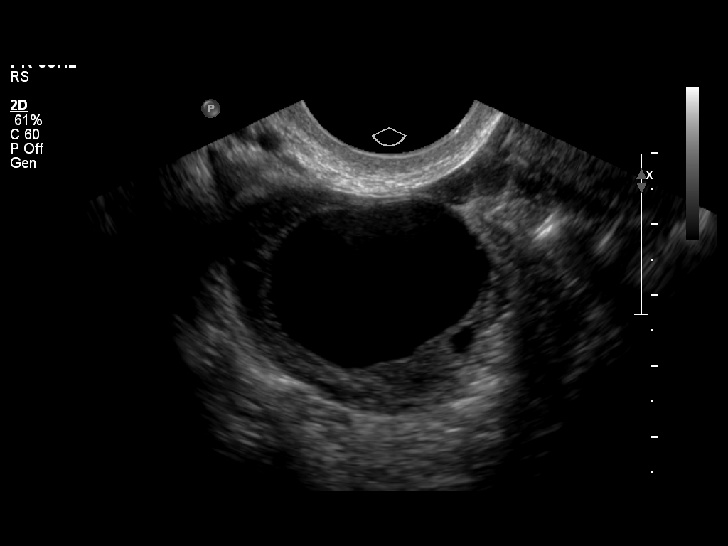
[im 26/46]
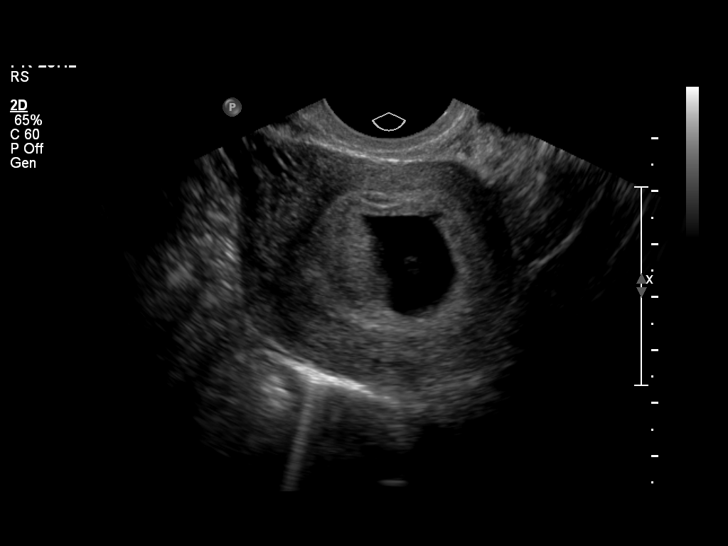
[im 29/46]
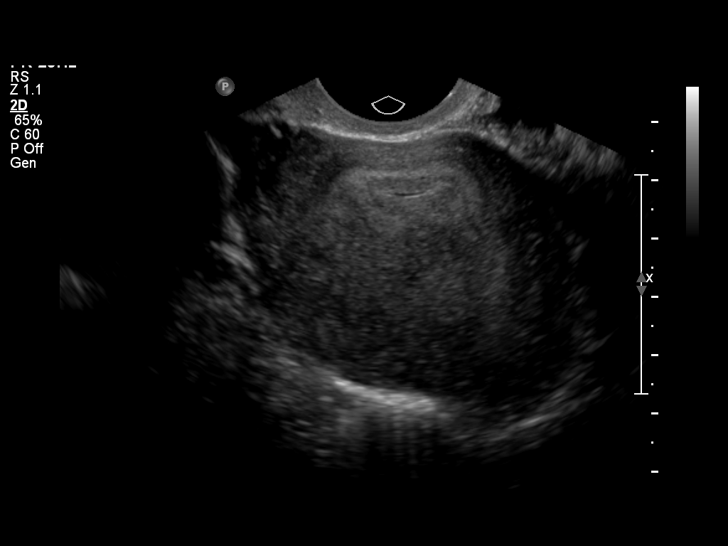
[im 32/46]
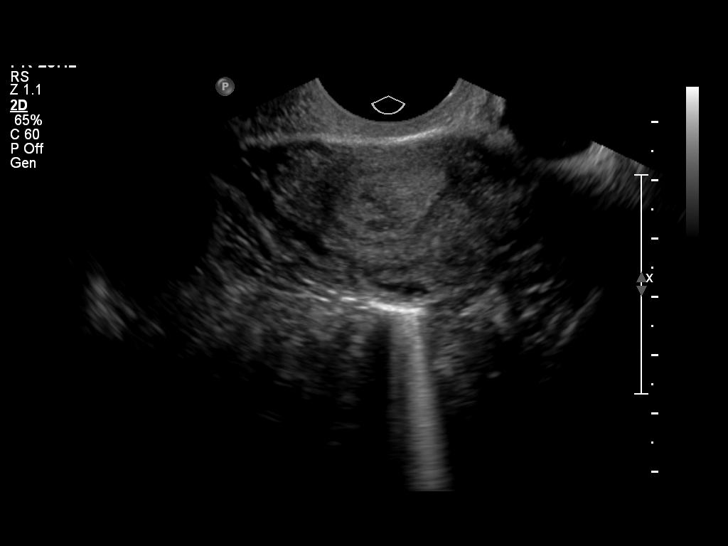
[im 36/46]
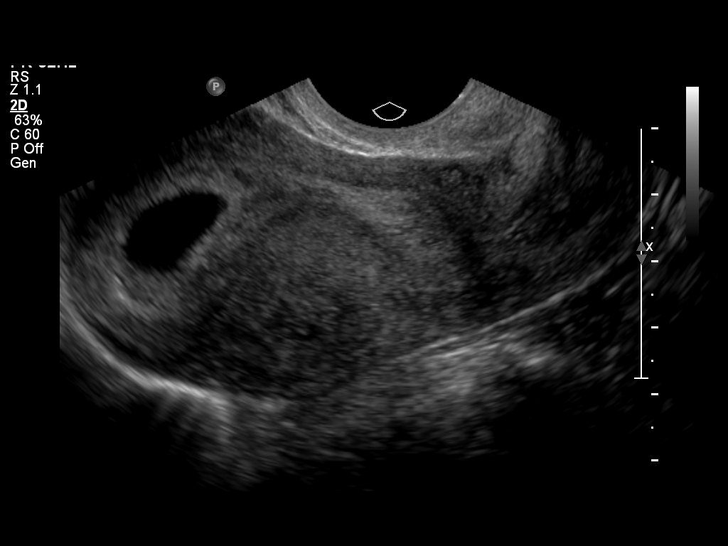
[im 39/46]
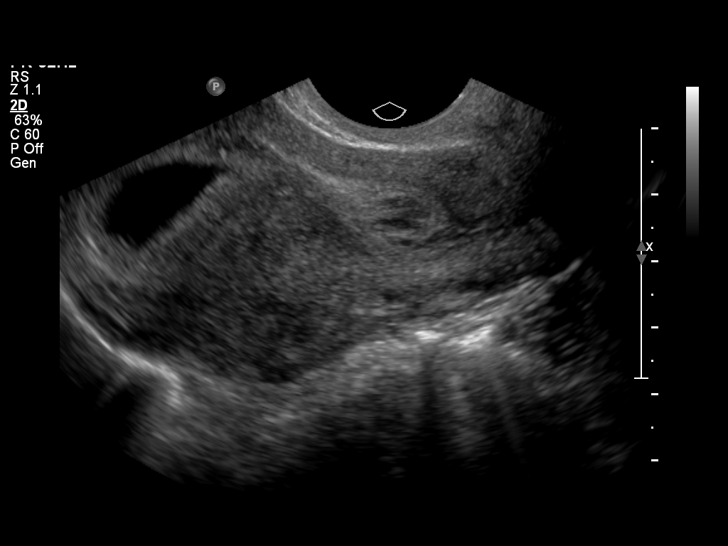
[im 42/46]
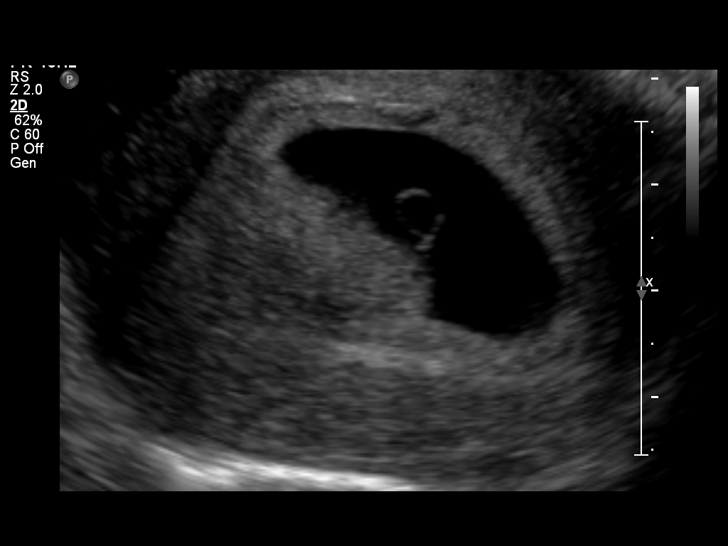
[im 46/46]
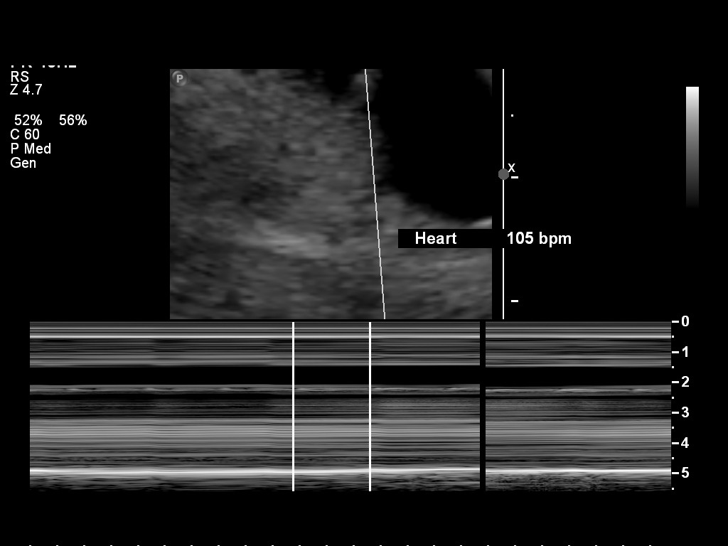

[14 of 28 positions shown; findings below may reference images not displayed]

Intrauterine gestational sac:  Visualized/normal in shape.
Yolk sac: Visualized
Embryo: Visualized
Cardiac Activity: Visualized
Heart Rate: 105 bpm

CRL: 4   mm  6   w   1   d          US EDC: 05/09/2011

Maternal uterus/adnexae:
No evidence of subchronic hemorrhage or fibroids.  Normal left
ovary.  3.2 cm simple right ovarian corpus luteum cyst.  No
evidence of adnexal mass or free fluid.
IMPRESSION: 1.  Single living IUP.  US EGA is concordant with LMP.
2.  3 cm right ovarian corpus luteum cyst.

## 2012-10-31 ENCOUNTER — Inpatient Hospital Stay (HOSPITAL_COMMUNITY)
Admission: AD | Admit: 2012-10-31 | Discharge: 2012-10-31 | Disposition: A | Discharge status: Home / Self Care | Payer: No Typology Code available for payment source | Source: Ambulatory Visit | Attending: Obstetrics & Gynecology | Admitting: Obstetrics & Gynecology

## 2012-10-31 ENCOUNTER — Encounter (HOSPITAL_COMMUNITY): Payer: Self-pay | Admitting: *Deleted

## 2012-10-31 DIAGNOSIS — K117 Disturbances of salivary secretion: Secondary | ICD-10-CM

## 2012-10-31 DIAGNOSIS — R109 Unspecified abdominal pain: Secondary | ICD-10-CM | POA: Insufficient documentation

## 2012-10-31 DIAGNOSIS — O210 Mild hyperemesis gravidarum: Secondary | ICD-10-CM | POA: Insufficient documentation

## 2012-10-31 LAB — URINE MICROSCOPIC-ADD ON

## 2012-10-31 LAB — URINALYSIS, ROUTINE W REFLEX MICROSCOPIC
Bilirubin Urine: NEGATIVE
Glucose, UA: NEGATIVE mg/dL
Ketones, ur: 15 mg/dL — AB
Nitrite: NEGATIVE
Specific Gravity, Urine: >1.03 — ABNORMAL HIGH (ref 1.005–1.030)
pH: 6 (ref 5.0–8.0)

## 2012-10-31 MED ORDER — GLYCOPYRROLATE 1 MG PO TABS: 1.0000 mg | ORAL_TABLET | Freq: Three times a day (TID) | ORAL | Status: DC

## 2012-10-31 MED ORDER — ONDANSETRON 8 MG PO TBDP: 8.0000 mg | ORAL_TABLET | Freq: Three times a day (TID) | ORAL | Status: DC | PRN

## 2012-10-31 MED ORDER — ONDANSETRON 8 MG PO TBDP
8.0000 mg | ORAL_TABLET | Freq: Once | ORAL | Status: AC
  Administered 2012-10-31: 8 mg via ORAL
  Filled 2012-10-31: qty 1

## 2012-10-31 NOTE — MAU Provider Note (Addendum)
  History     CSN: [id number]  Arrival date and time: 10/31/12 1633   None     Chief Complaint  Patient presents with  . Emesis  . Possible Pregnancy   HPI Pt is a [id number] here at 7.3 wks IUP here with report of increased spitting and nausea/vomiting within past week. Denies fever, body aches, or chills.  No report of vaginal bleeding, cramping with vomiting.  Same symptoms in previous pregnancy, Zofran helped with symptoms.    Past Medical History  Diagnosis Date  . Heart murmur     no abx  . Anemia     Past Surgical History  Procedure Laterality Date  . No past surgeries      History reviewed. No pertinent family history.  History  Substance Use Topics  . Smoking status: Never Smoker   . Smokeless tobacco: Never Used  . Alcohol Use: No    Allergies: No Known Allergies  No prescriptions prior to admission    Review of Systems  Constitutional: Positive for weight loss (5lbs in past week). Negative for fever and chills.  Gastrointestinal: Positive for nausea, vomiting and abdominal pain (cramping with vomitng).  All other systems reviewed and are negative.   Physical Exam   Blood pressure 129/81, pulse 73, temperature 99.4 F (37.4 C), temperature source Oral, resp. rate 16, height 5\' 4"  (1.626 m), weight 70.852 kg (156 lb 3.2 oz), last menstrual period 09/09/2012, SpO2 99.00%.  Physical Exam  Constitutional: She is oriented to person, place, and time. She appears well-developed and well-nourished. No distress.  HENT:  Head: Normocephalic.  Mouth/Throat: Mucous membranes are normal. Mucous membranes are not dry.  Neck: Normal range of motion. Neck supple.  Cardiovascular: Normal rate, regular rhythm and normal heart sounds.   Respiratory: Effort normal and breath sounds normal.  GI: Soft. There is no tenderness.  Genitourinary: No bleeding around the vagina.  Neurological: She is alert and oriented to person, place, and time.  Skin: Skin is warm and dry.     MAU Course  Procedures  2000 PO Zofran given  2005 Report given to M. Mayford Knife who assumes care of patient.   [facility]   Assessment and Plan  Feeling better after meds Able to keep down fluids Will Rx Zofran and Robinul  Followup with prenatal care Wynelle Bourgeois CNM

## 2012-10-31 NOTE — MAU Note (Signed)
Patient states she had a positive pregnancy test about one week ago at the [facility]. Has had nausea, vomiting and spitting for about one week. Has some abdominal and back pain. Denies bleeding or discharge.

## 2012-11-03 LAB — URINE CULTURE

## 2013-08-15 ENCOUNTER — Encounter (HOSPITAL_COMMUNITY): Payer: Self-pay

## 2013-08-15 ENCOUNTER — Inpatient Hospital Stay (HOSPITAL_COMMUNITY)
Admission: AD | Admit: 2013-08-15 | Discharge: 2013-08-15 | Disposition: A | Discharge status: Home / Self Care | Payer: No Typology Code available for payment source | Source: Ambulatory Visit | Attending: Family Medicine | Admitting: Family Medicine

## 2013-08-15 ENCOUNTER — Inpatient Hospital Stay (HOSPITAL_COMMUNITY): Payer: No Typology Code available for payment source

## 2013-08-15 DIAGNOSIS — R1031 Right lower quadrant pain: Secondary | ICD-10-CM | POA: Insufficient documentation

## 2013-08-15 DIAGNOSIS — O9989 Other specified diseases and conditions complicating pregnancy, childbirth and the puerperium: Secondary | ICD-10-CM | POA: Insufficient documentation

## 2013-08-15 DIAGNOSIS — O219 Vomiting of pregnancy, unspecified: Secondary | ICD-10-CM

## 2013-08-15 DIAGNOSIS — O210 Mild hyperemesis gravidarum: Secondary | ICD-10-CM

## 2013-08-15 DIAGNOSIS — R109 Unspecified abdominal pain: Secondary | ICD-10-CM

## 2013-08-15 DIAGNOSIS — K117 Disturbances of salivary secretion: Secondary | ICD-10-CM | POA: Insufficient documentation

## 2013-08-15 DIAGNOSIS — O26899 Other specified pregnancy related conditions, unspecified trimester: Secondary | ICD-10-CM

## 2013-08-15 HISTORY — DX: Urinary tract infection, site not specified: N39.0

## 2013-08-15 LAB — URINE MICROSCOPIC-ADD ON

## 2013-08-15 LAB — URINALYSIS, ROUTINE W REFLEX MICROSCOPIC
BILIRUBIN URINE: NEGATIVE
Glucose, UA: NEGATIVE mg/dL
HGB URINE DIPSTICK: NEGATIVE
KETONES UR: 15 mg/dL — AB
Nitrite: NEGATIVE
PROTEIN: NEGATIVE mg/dL
Specific Gravity, Urine: 1.025 (ref 1.005–1.030)
UROBILINOGEN UA: 1 mg/dL (ref 0.0–1.0)
pH: 7 (ref 5.0–8.0)

## 2013-08-15 LAB — CBC
HCT: 41 % (ref 36.0–46.0)
Hemoglobin: 13.8 g/dL (ref 12.0–15.0)
MCH: 28.3 pg (ref 26.0–34.0)
MCHC: 33.7 g/dL (ref 30.0–36.0)
MCV: 84.2 fL (ref 78.0–100.0)
PLATELETS: 307 10*3/uL (ref 150–400)
RBC: 4.87 MIL/uL (ref 3.87–5.11)
RDW: 13.8 % (ref 11.5–15.5)
WBC: 8.9 10*3/uL (ref 4.0–10.5)

## 2013-08-15 LAB — HCG, QUANTITATIVE, PREGNANCY: hCG, Beta Chain, Quant, S: 36851 m[IU]/mL — ABNORMAL HIGH (ref <5)

## 2013-08-15 LAB — POCT PREGNANCY, URINE: Preg Test, Ur: POSITIVE — AB

## 2013-08-15 MED ORDER — GLYCOPYRROLATE 2 MG PO TABS: 2.0000 mg | ORAL_TABLET | Freq: Three times a day (TID) | ORAL | Status: DC | PRN

## 2013-08-15 MED ORDER — LACTATED RINGERS IV SOLN
INTRAVENOUS | Status: DC
Start: 2013-08-15 — End: 2013-08-15
  Administered 2013-08-15: 16:00:00 via INTRAVENOUS

## 2013-08-15 MED ORDER — ONDANSETRON HCL 4 MG/2ML IJ SOLN
4.0000 mg | INTRAMUSCULAR | Status: AC
  Administered 2013-08-15: 4 mg via INTRAVENOUS
  Filled 2013-08-15: qty 2

## 2013-08-15 MED ORDER — ONDANSETRON 4 MG PO TBDP: 4.0000 mg | ORAL_TABLET | Freq: Four times a day (QID) | ORAL | Status: DC | PRN

## 2013-08-15 MED ORDER — PROMETHAZINE HCL 25 MG PO TABS: 12.5000 mg | ORAL_TABLET | Freq: Four times a day (QID) | ORAL | Status: DC | PRN

## 2013-08-15 MED ORDER — GLYCOPYRROLATE 0.2 MG/ML IJ SOLN
0.1000 mg | Freq: Once | INTRAMUSCULAR | Status: AC
  Administered 2013-08-15: 0.1 mg via INTRAVENOUS
  Filled 2013-08-15: qty 0.5

## 2013-08-15 MED ORDER — PROMETHAZINE HCL 25 MG/ML IJ SOLN
25.0000 mg | Freq: Once | INTRAVENOUS | Status: AC
  Administered 2013-08-15: 25 mg via INTRAVENOUS
  Filled 2013-08-15: qty 1

## 2013-08-15 NOTE — MAU Provider Note (Signed)
Chief Complaint: Possible Pregnancy, Emesis and Abdominal Pain   First Provider Initiated Contact with Patient 08/15/13 1353     SUBJECTIVE HPI: Carolyn Case is a 28 y.o. [id number] at 5938w6d by LMP who presents to maternity admissions reporting n/v, right side and RLQ abdominal pain x1 week, worsening today.  She had positive pregnancy test at home this week. She denies vaginal bleeding, vaginal itching/burning, urinary symptoms, h/a, dizziness, or fever/chills.     Past Medical History  Diagnosis Date  . Heart murmur     no abx  . Anemia   . UTI (lower urinary tract infection)    Past Surgical History  Procedure Laterality Date  . No past surgeries     History   Social History  . Marital Status: Single    Spouse Name: N/A    Number of Children: N/A  . Years of Education: N/A   Occupational History  . Not on file.   Social History Main Topics  . Smoking status: Never Smoker   . Smokeless tobacco: Never Used  . Alcohol Use: No  . Drug Use: No  . Sexual Activity: Yes    Birth Control/ Protection: None     Comment: unsure of birth control   Other Topics Concern  . Not on file   Social History Narrative  . No narrative on file   No current facility-administered medications on file prior to encounter.   No current outpatient prescriptions on file prior to encounter.   No Known Allergies  ROS: Pertinent items in HPI  OBJECTIVE Blood pressure 118/78, pulse 87, resp. rate 16, height 5' 3.5" (1.613 m), weight 78.382 kg (172 lb 12.8 oz), last menstrual period 06/28/2013, SpO2 98.00%, unknown if currently breastfeeding. GENERAL: Well-developed, well-nourished female in no acute distress.  HEENT: Normocephalic HEART: normal rate RESP: normal effort ABDOMEN: Soft, non-tender EXTREMITIES: Nontender, no edema NEURO: Alert and oriented SPECULUM EXAM: Declined by pt.  Pt initially in hallway bed, declined by the time room became available r/t wait time.  Pt plans to start  prenatal care soon.   LAB RESULTS Results for orders placed during the hospital encounter of 08/15/13 (from the past 24 hour(s))  URINALYSIS, ROUTINE W REFLEX MICROSCOPIC     Status: Abnormal   Collection Time    08/15/13  1:20 PM      Result Value Ref Range   Color, Urine YELLOW  YELLOW   APPearance CLEAR  CLEAR   Specific Gravity, Urine 1.025  1.005 - 1.030   pH 7.0  5.0 - 8.0   Glucose, UA NEGATIVE  NEGATIVE mg/dL   Hgb urine dipstick NEGATIVE  NEGATIVE   Bilirubin Urine NEGATIVE  NEGATIVE   Ketones, ur 15 (*) NEGATIVE mg/dL   Protein, ur NEGATIVE  NEGATIVE mg/dL   Urobilinogen, UA 1.0  0.0 - 1.0 mg/dL   Nitrite NEGATIVE  NEGATIVE   Leukocytes, UA SMALL (*) NEGATIVE  URINE MICROSCOPIC-ADD ON     Status: Abnormal   Collection Time    08/15/13  1:20 PM      Result Value Ref Range   Squamous Epithelial / LPF MANY (*) RARE   WBC, UA 3-6  <3 WBC/hpf   RBC / HPF 3-6  <3 RBC/hpf   Bacteria, UA MANY (*) RARE   Urine-Other MUCOUS PRESENT    POCT PREGNANCY, URINE     Status: Abnormal   Collection Time    08/15/13  1:35 PM      Result Value Ref Range  Preg Test, Ur POSITIVE (*) NEGATIVE  CBC     Status: None   Collection Time    08/15/13  1:41 PM      Result Value Ref Range   WBC 8.9  4.0 - 10.5 K/uL   RBC 4.87  3.87 - 5.11 MIL/uL   Hemoglobin 13.8  12.0 - 15.0 g/dL   HCT 16.1  09.6 - 04.5 %   MCV 84.2  78.0 - 100.0 fL   MCH 28.3  26.0 - 34.0 pg   MCHC 33.7  30.0 - 36.0 g/dL   RDW 40.9  81.1 - 91.4 %   Platelets 307  150 - 400 K/uL  HCG, QUANTITATIVE, PREGNANCY     Status: Abnormal   Collection Time    08/15/13  1:41 PM      Result Value Ref Range   hCG, Beta Chain, Quant, [place] [address] (*) <5 mIU/mL    IMAGING US Ob Comp Less 14 Wks  08/15/2013   CLINICAL DATA:  Right lower quadrant pain in early pregnancy. Quantitative beta HCG is 36,851. Gravida 4 para 1, ab 2. LMP 06/28/2013. By LMP EDC is 04/04/2014 and gestational age is 6 weeks 6 days.  EXAM: TRANSVAGINAL OB  ULTRASOUND; OBSTETRIC <14 WK ULTRASOUND  TECHNIQUE: Transvaginal ultrasound was performed for complete evaluation of the gestation as well as the maternal uterus, adnexal regions, and pelvic cul-de-sac.  COMPARISON:  None applicable  FINDINGS: Intrauterine gestational sac: Present  Yolk sac:  Present  Embryo:  Present  Cardiac Activity: Present  Heart Rate: 109 bpm  CRL:   2.8  mm   6 w 0 d                  [place] EDC: 04/10/2014  Maternal uterus/adnexae: Small subchorionic hemorrhage identified. No free pelvic fluid identified. The ovaries have a normal appearance.  IMPRESSION: 1. Single living intrauterine embryo. 2. Clinical dating differs from today's ultrasound dating. 3. Ultrasound EDC is 04/10/2014.   Electronically Signed   By: Rosalie Gums M.D.   On: 08/15/2013 15:18   US Ob Transvaginal  08/15/2013   CLINICAL DATA:  Right lower quadrant pain in early pregnancy. Quantitative beta HCG is 36,851. Gravida 4 para 1, ab 2. LMP 06/28/2013. By LMP EDC is 04/04/2014 and gestational age is 6 weeks 6 days.  EXAM: TRANSVAGINAL OB ULTRASOUND; OBSTETRIC <14 WK ULTRASOUND  TECHNIQUE: Transvaginal ultrasound was performed for complete evaluation of the gestation as well as the maternal uterus, adnexal regions, and pelvic cul-de-sac.  COMPARISON:  None applicable  FINDINGS: Intrauterine gestational sac: Present  Yolk sac:  Present  Embryo:  Present  Cardiac Activity: Present  Heart Rate: 109 bpm  CRL:   2.8  mm   6 w 0 d                  [place] EDC: 04/10/2014  Maternal uterus/adnexae: Small subchorionic hemorrhage identified. No free pelvic fluid identified. The ovaries have a normal appearance.  IMPRESSION: 1. Single living intrauterine embryo. 2. Clinical dating differs from today's ultrasound dating. 3. Ultrasound EDC is 04/10/2014.   Electronically Signed   By: Rosalie Gums M.D.   On: 08/15/2013 15:18    ASSESSMENT 1. Nausea and vomiting in pregnancy prior to [redacted] weeks gestation   2. Abdominal pain in pregnancy   3.  Ptyalism     PLAN I V fluids, Phenergan, Zofran, Robinul IV in MAU Discharge home Phenergan 12.5-25 mg PO Q 6 hours PRN Zofran  4 mg PO Q 8 hours PRN Robinul 1-2 mg TID PRN spitting F/U with early prenatal care as planned in [place]    Medication List         glycopyrrolate 2 MG tablet  Commonly known as:  ROBINUL  Take 1 tablet (2 mg total) by mouth 3 (three) times daily as needed.     ondansetron 4 MG disintegrating tablet  Commonly known as:  ZOFRAN ODT  Take 1 tablet (4 mg total) by mouth every 6 (six) hours as needed for nausea.     promethazine 25 MG tablet  Commonly known as:  PHENERGAN  Take 0.5-1 tablets (12.5-25 mg total) by mouth every 6 (six) hours as needed.       Follow-up Information   Please follow up. ([facility].  Return to MAU as needed for emergencies. )       Sharen CounterLisa Leftwich-Kirby Certified Nurse-Midwife 08/15/2013  4:57 PM

## 2013-08-15 NOTE — Discharge Instructions (Signed)
Morning Sickness Morning sickness is when you feel sick to your stomach (nauseous) during pregnancy. This nauseous feeling may or may not come with vomiting. It often occurs in the morning but can be a problem any time of day. Morning sickness is most common during the first trimester, but it may continue throughout pregnancy. While morning sickness is unpleasant, it is usually harmless unless you develop severe and continual vomiting (hyperemesis gravidarum). This condition requires more intense treatment.  CAUSES  The cause of morning sickness is not completely known but seems to be related to normal hormonal changes that occur in pregnancy. RISK FACTORS You are at greater risk if you:  Experienced nausea or vomiting before your pregnancy.  Had morning sickness during a previous pregnancy.  Are pregnant with more than one baby, such as twins. TREATMENT  Do not use any medicines (prescription, over-the-counter, or herbal) for morning sickness without first talking to your health care provider. Your health care provider may prescribe or recommend:  Vitamin B6 supplements.  Anti-nausea medicines.  The herbal medicine ginger. HOME CARE INSTRUCTIONS   Only take over-the-counter or prescription medicines as directed by your health care provider.  Taking multivitamins before getting pregnant can prevent or decrease the severity of morning sickness in most women.   Eat a piece of dry toast or unsalted crackers before getting out of bed in the morning.   Eat five or six small meals a day.   Eat dry and bland foods (rice, baked potato). Foods high in carbohydrates are often helpful.  Do not drink liquids with your meals. Drink liquids between meals.   Avoid greasy, fatty, and spicy foods.   Get someone to cook for you if the smell of any food causes nausea and vomiting.   If you feel nauseous after taking prenatal vitamins, take the vitamins at night or with a snack.  Snack  on protein foods (nuts, yogurt, cheese) between meals if you are hungry.   Eat unsweetened gelatins for desserts.   Wearing an acupressure wristband (worn for sea sickness) may be helpful.   Acupuncture may be helpful.   Do not smoke.   Get a humidifier to keep the air in your house free of odors.   Get plenty of fresh air. SEEK MEDICAL CARE IF:   Your home remedies are not working, and you need medicine.  You feel dizzy or lightheaded.  You are losing weight. SEEK IMMEDIATE MEDICAL CARE IF:   You have persistent and uncontrolled nausea and vomiting.  You pass out (faint). Document Released: 06/08/2006 Document Revised: 12/18/2012 Document Reviewed: 10/02/2012 [facility] Patient Information 2014 [place], [place].  Abdominal Pain During Pregnancy Abdominal pain is common in pregnancy. Most of the time, it does not cause harm. There are many causes of abdominal pain. Some causes are more serious than others. Some of the causes of abdominal pain in pregnancy are easily diagnosed. Occasionally, the diagnosis takes time to understand. Other times, the cause is not determined. Abdominal pain can be a sign that something is very wrong with the pregnancy, or the pain may have nothing to do with the pregnancy at all. For this reason, always tell your health care provider if you have any abdominal discomfort. HOME CARE INSTRUCTIONS  Monitor your abdominal pain for any changes. The following actions may help to alleviate any discomfort you are experiencing:  Do not have sexual intercourse or put anything in your vagina until your symptoms go away completely.  Get plenty of rest until  your pain improves.  Drink clear fluids if you feel nauseous. Avoid solid food as long as you are uncomfortable or nauseous.  Only take over-the-counter or prescription medicine as directed by your health care provider.  Keep all follow-up appointments with your health care provider. SEEK IMMEDIATE  MEDICAL CARE IF:  You are bleeding, leaking fluid, or passing tissue from the vagina.  You have increasing pain or cramping.  You have persistent vomiting.  You have painful or bloody urination.  You have a fever.  You notice a decrease in your baby's movements.  You have extreme weakness or feel faint.  You have shortness of breath, with or without abdominal pain.  You develop a severe headache with abdominal pain.  You have abnormal vaginal discharge with abdominal pain.  You have persistent diarrhea.  You have abdominal pain that continues even after rest, or gets worse. MAKE SURE YOU:   Understand these instructions.  Will watch your condition.  Will get help right away if you are not doing well or get worse. Document Released: 04/17/2005 Document Revised: 02/05/2013 Document Reviewed: 11/14/2012 [facility] Patient Information 2014 [place], [place].

## 2013-08-15 NOTE — MAU Provider Note (Signed)
Attestation of Attending Supervision of Advanced Practitioner (PA/CNM/NP): Evaluation and management procedures were performed by the Advanced Practitioner under my supervision and collaboration.  I have reviewed the Advanced Practitioner's note and chart, and I agree with the management and plan.  Terrance Lanahan S Ayris Carano, MD [facility] Attending 08/15/2013 9:58 PM   

## 2013-08-15 NOTE — MAU Note (Signed)
[facility] States she has had constant pain on her right side and constant vomiting for about one week. Actively vomiting in triage. Denies bleeding but has a vaginal discharge.

## 2014-03-02 ENCOUNTER — Encounter (HOSPITAL_COMMUNITY): Payer: Self-pay

## 2014-06-20 ENCOUNTER — Encounter (HOSPITAL_COMMUNITY): Payer: Self-pay | Admitting: *Deleted

## 2015-02-08 IMAGING — US US OB TRANSVAGINAL
1 series · 14 of 28 positions shown · non-contrast
Comparison: None applicable

CLINICAL DATA: Right lower quadrant pain in early pregnancy.
Quantitative beta HCG is [DATE]. Gravida 4 para 1, ab 2. LMP
06/28/2013. By LMP EDC is 04/04/2014 and gestational age is 6 weeks
6 days.

EXAM:
TRANSVAGINAL OB ULTRASOUND; OBSTETRIC <14 WK ULTRASOUND
TECHNIQUE: Transvaginal ultrasound was performed for complete evaluation of the
gestation as well as the maternal uterus, adnexal regions, and
pelvic cul-de-sac.

[Series 1: us ob comp less 14 wks · 14 of 62 slices shown]
[im 3/62]
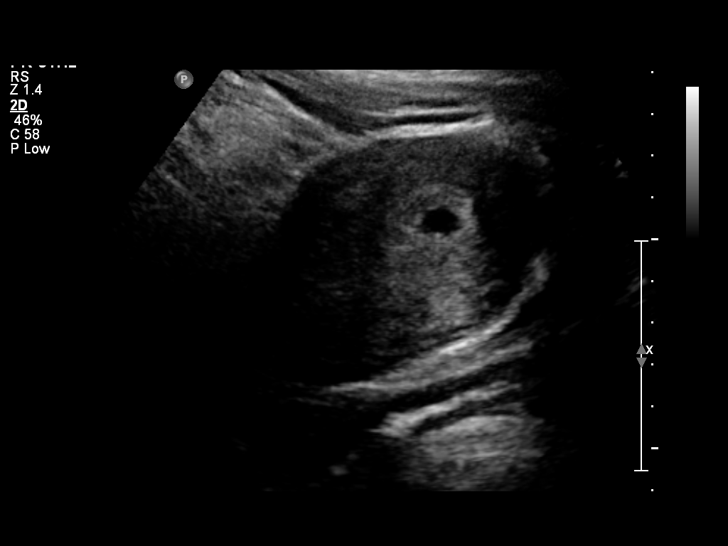
[im 7/62]
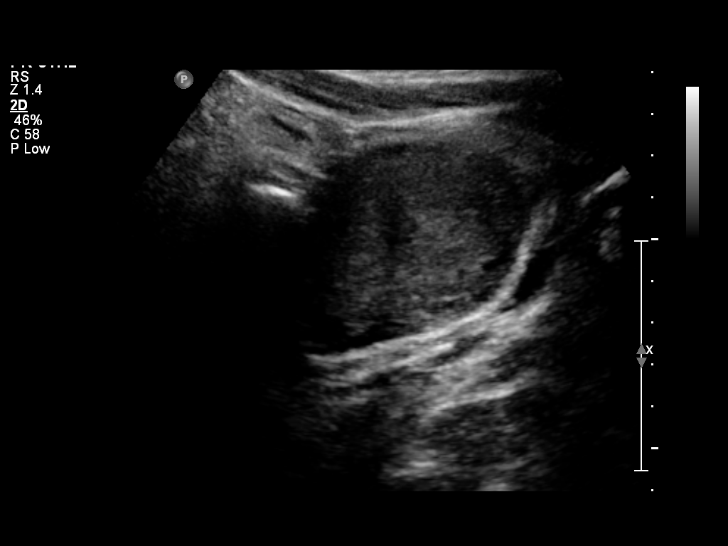
[im 12/62]
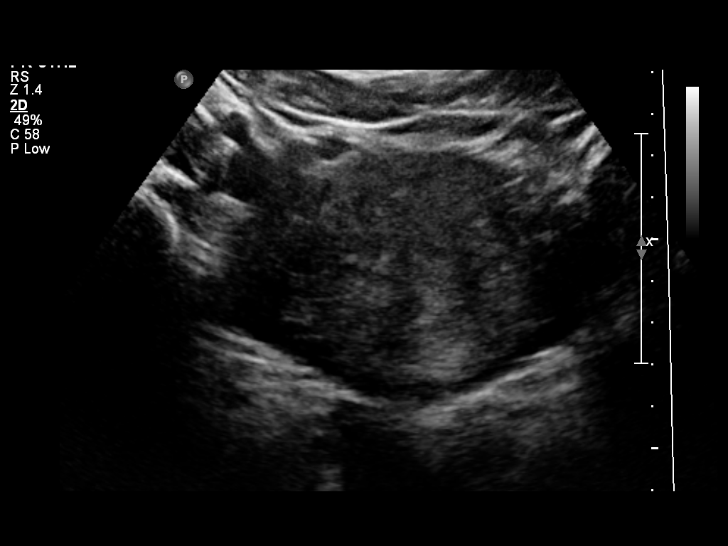
[im 16/62]
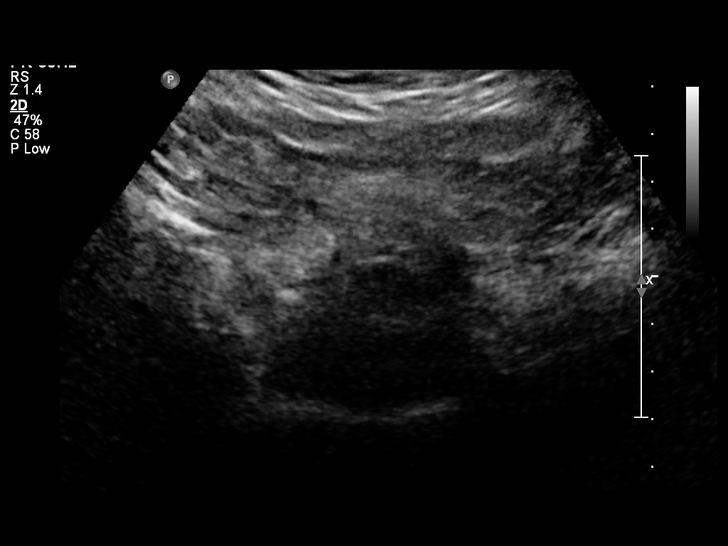
[im 21/62]
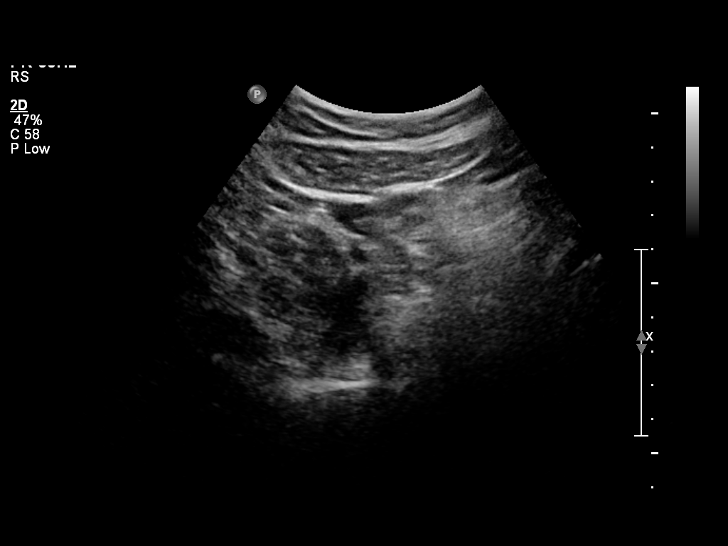
[im 25/62]
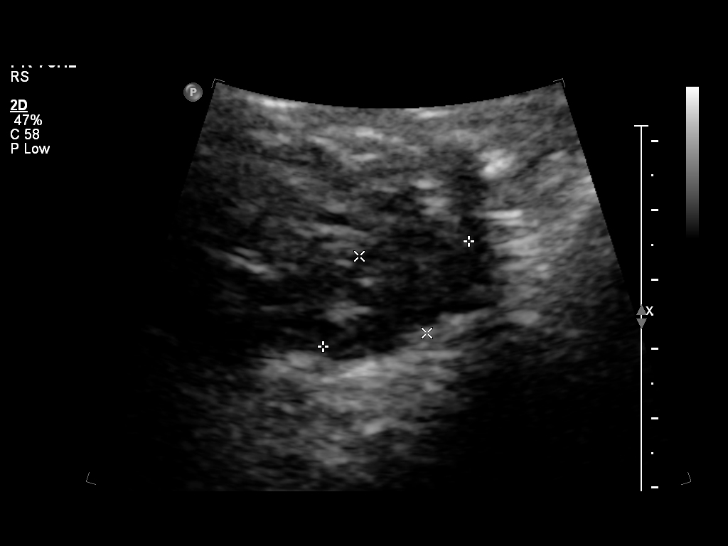
[im 30/62]
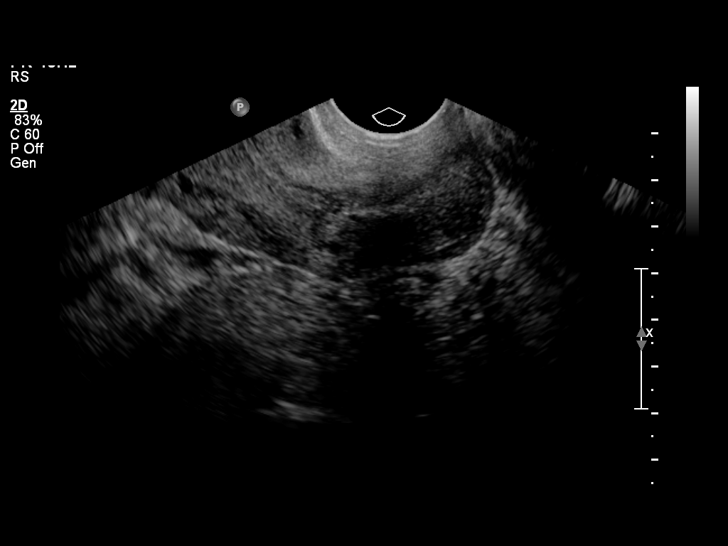
[im 34/62]
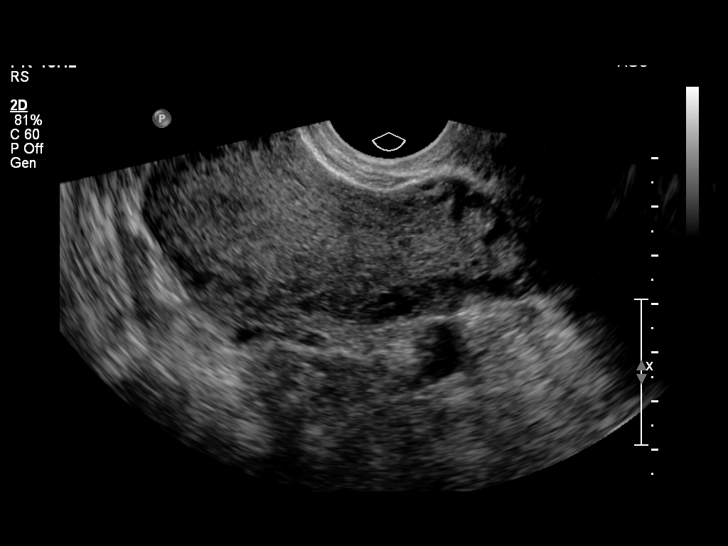
[im 39/62]
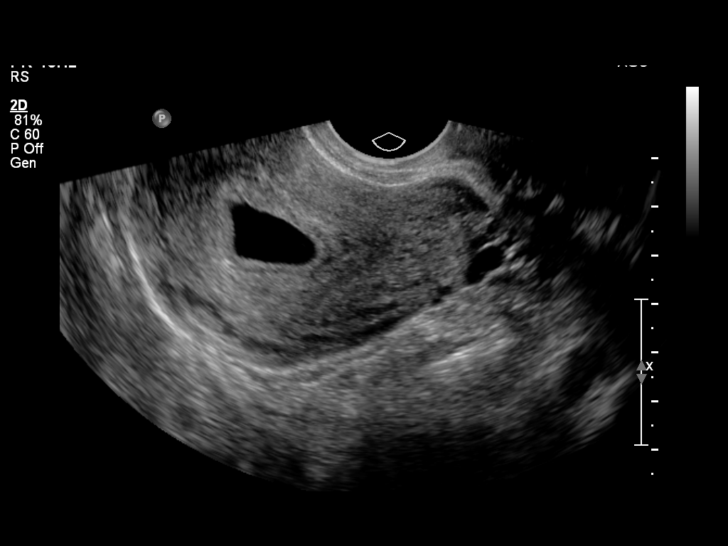
[im 43/62]
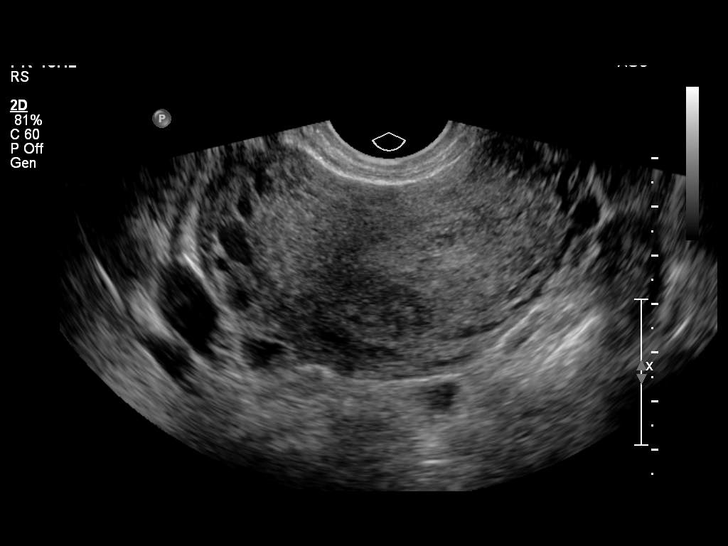
[im 48/62]
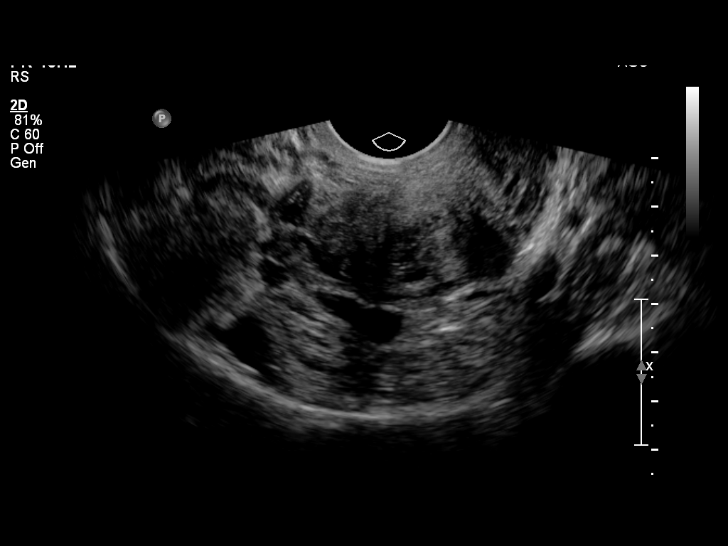
[im 52/62]
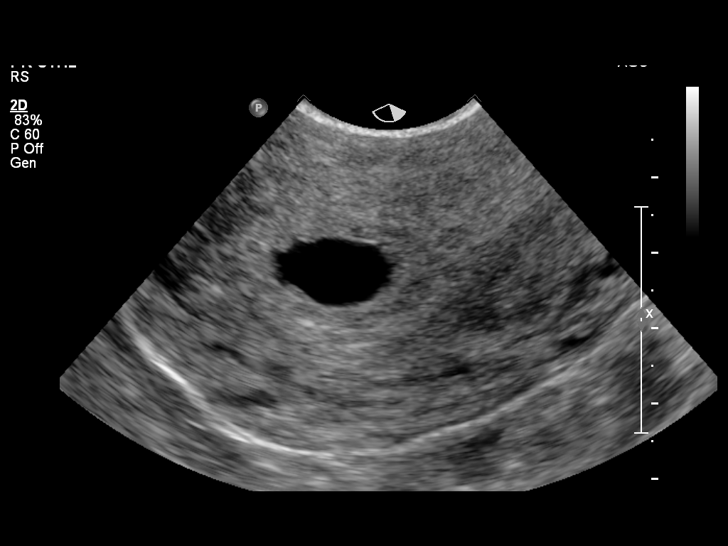
[im 57/62]
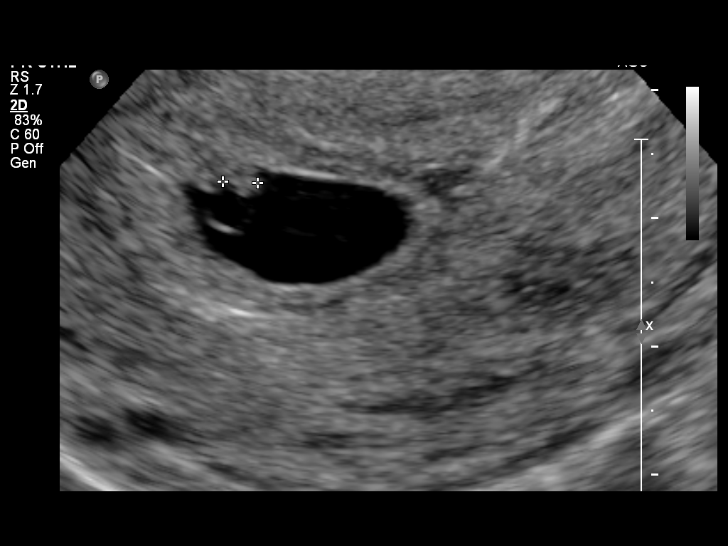
[im 62/62]
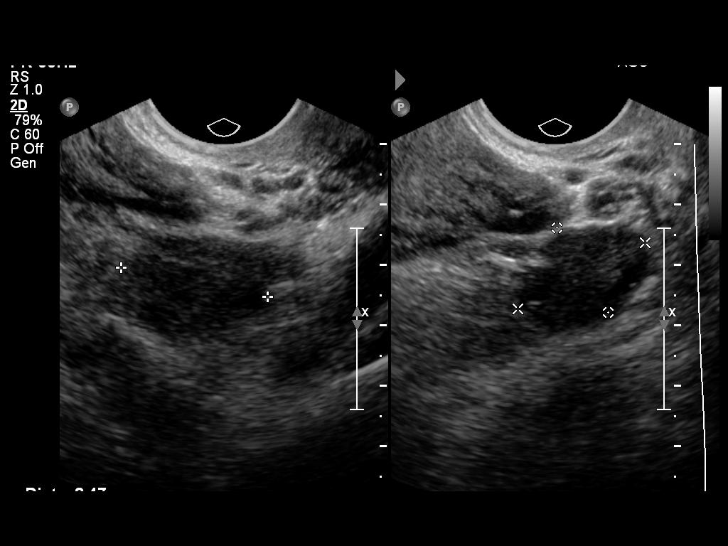

[14 of 28 positions shown; findings below may reference images not displayed]

FINDINGS: Intrauterine gestational sac: Present

Yolk sac:  Present

Embryo:  Present

Cardiac Activity: Present

Heart Rate: 109 bpm

CRL:   2.8  mm   6 w 0 d                  US EDC: 04/10/2014

Maternal uterus/adnexae: Small subchorionic hemorrhage identified.
No free pelvic fluid identified. The ovaries have a normal
appearance.
IMPRESSION: 1. Single living intrauterine embryo.
2. Clinical dating differs from today's ultrasound dating.
3. Ultrasound EDC is 04/10/2014.

## 2015-07-20 ENCOUNTER — Encounter (HOSPITAL_COMMUNITY): Payer: Self-pay | Admitting: Medical

## 2015-07-20 ENCOUNTER — Inpatient Hospital Stay (HOSPITAL_COMMUNITY): Payer: Managed Care, Other (non HMO)

## 2015-07-20 ENCOUNTER — Inpatient Hospital Stay (HOSPITAL_COMMUNITY)
Admission: AD | Admit: 2015-07-20 | Discharge: 2015-07-20 | Disposition: A | Discharge status: Home / Self Care | Payer: Managed Care, Other (non HMO) | Source: Ambulatory Visit | Attending: Obstetrics & Gynecology | Admitting: Obstetrics & Gynecology

## 2015-07-20 DIAGNOSIS — A599 Trichomoniasis, unspecified: Secondary | ICD-10-CM | POA: Diagnosis not present

## 2015-07-20 DIAGNOSIS — O26891 Other specified pregnancy related conditions, first trimester: Secondary | ICD-10-CM | POA: Insufficient documentation

## 2015-07-20 DIAGNOSIS — O26899 Other specified pregnancy related conditions, unspecified trimester: Secondary | ICD-10-CM

## 2015-07-20 DIAGNOSIS — O209 Hemorrhage in early pregnancy, unspecified: Secondary | ICD-10-CM | POA: Diagnosis present

## 2015-07-20 DIAGNOSIS — R109 Unspecified abdominal pain: Secondary | ICD-10-CM | POA: Diagnosis not present

## 2015-07-20 DIAGNOSIS — O98311 Other infections with a predominantly sexual mode of transmission complicating pregnancy, first trimester: Secondary | ICD-10-CM | POA: Insufficient documentation

## 2015-07-20 DIAGNOSIS — O219 Vomiting of pregnancy, unspecified: Secondary | ICD-10-CM | POA: Diagnosis not present

## 2015-07-20 DIAGNOSIS — Z3A01 Less than 8 weeks gestation of pregnancy: Secondary | ICD-10-CM | POA: Diagnosis not present

## 2015-07-20 DIAGNOSIS — O9989 Other specified diseases and conditions complicating pregnancy, childbirth and the puerperium: Secondary | ICD-10-CM

## 2015-07-20 LAB — CBC WITH DIFFERENTIAL/PLATELET
Basophils Absolute: 0 10*3/uL (ref 0.0–0.1)
Basophils Relative: 0 %
Eosinophils Absolute: 0 10*3/uL (ref 0.0–0.7)
Eosinophils Relative: 0 %
HCT: 38.6 % (ref 36.0–46.0)
Hemoglobin: 13.2 g/dL (ref 12.0–15.0)
LYMPHS PCT: 22 %
Lymphs Abs: 1.8 10*3/uL (ref 0.7–4.0)
MCH: 28.6 pg (ref 26.0–34.0)
MCHC: 34.2 g/dL (ref 30.0–36.0)
MCV: 83.7 fL (ref 78.0–100.0)
MONO ABS: 0.6 10*3/uL (ref 0.1–1.0)
Monocytes Relative: 8 %
Neutro Abs: 5.6 10*3/uL (ref 1.7–7.7)
Neutrophils Relative %: 70 %
PLATELETS: 316 10*3/uL (ref 150–400)
RBC: 4.61 MIL/uL (ref 3.87–5.11)
RDW: 14.1 % (ref 11.5–15.5)
WBC: 8.1 10*3/uL (ref 4.0–10.5)

## 2015-07-20 LAB — URINE MICROSCOPIC-ADD ON: RBC / HPF: NONE SEEN RBC/hpf (ref 0–5)

## 2015-07-20 LAB — URINALYSIS, ROUTINE W REFLEX MICROSCOPIC
BILIRUBIN URINE: NEGATIVE
Glucose, UA: NEGATIVE mg/dL
HGB URINE DIPSTICK: NEGATIVE
Ketones, ur: NEGATIVE mg/dL
Nitrite: NEGATIVE
Protein, ur: NEGATIVE mg/dL
SPECIFIC GRAVITY, URINE: 1.015 (ref 1.005–1.030)
pH: 7 (ref 5.0–8.0)

## 2015-07-20 LAB — HCG, QUANTITATIVE, PREGNANCY: hCG, Beta Chain, Quant, S: 73691 m[IU]/mL — ABNORMAL HIGH (ref <5)

## 2015-07-20 LAB — WET PREP, GENITAL
Clue Cells Wet Prep HPF POC: NONE SEEN
Sperm: NONE SEEN
WBC, Wet Prep HPF POC: NONE SEEN
Yeast Wet Prep HPF POC: NONE SEEN

## 2015-07-20 LAB — POCT PREGNANCY, URINE: Preg Test, Ur: POSITIVE — AB

## 2015-07-20 MED ORDER — LACTATED RINGERS IV BOLUS (SEPSIS)
1000.0000 mL | Freq: Once | INTRAVENOUS | Status: AC
  Administered 2015-07-20: 1000 mL via INTRAVENOUS

## 2015-07-20 MED ORDER — METRONIDAZOLE 500 MG PO TABS
2000.0000 mg | ORAL_TABLET | Freq: Once | ORAL | Status: AC
  Administered 2015-07-20: 2000 mg via ORAL
  Filled 2015-07-20: qty 4

## 2015-07-20 MED ORDER — ONDANSETRON HCL 4 MG/2ML IJ SOLN
4.0000 mg | Freq: Once | INTRAMUSCULAR | Status: AC
  Administered 2015-07-20: 4 mg via INTRAVENOUS
  Filled 2015-07-20: qty 2

## 2015-07-20 MED ORDER — METOCLOPRAMIDE HCL 5 MG/ML IJ SOLN
10.0000 mg | Freq: Once | INTRAMUSCULAR | Status: AC
  Administered 2015-07-20: 10 mg via INTRAVENOUS
  Filled 2015-07-20: qty 2

## 2015-07-20 MED ORDER — ONDANSETRON 4 MG PO TBDP: 4.0000 mg | ORAL_TABLET | Freq: Four times a day (QID) | ORAL | Status: DC | PRN

## 2015-07-20 NOTE — MAU Provider Note (Signed)
History     CSN: [id number]  Arrival date and time: 07/20/15 4098   First Provider Initiated Contact with Patient 07/20/15 2037      Chief Complaint  Patient presents with  . Possible Pregnancy  . Abdominal Cramping  . Vomiting   HPI Ms. Franchon Ketterman is a 30 y.o. [id number] at [redacted]w[redacted]d who presents to MAU today with complaint of abdominal pain and N/V. She states abdominal pain in the lower abdomen x 2-3 days. She denies vaginal bleeding, discharge or UTI symptoms. She has also had N/V for ~ 1 week but worse x 2-3 days. She states vomiting all day today. Unable to keep anything down. She denies diarrhea, constipation or fever. LMP 05/30/15 and recent +HPT.   OB History    Gravida Para Term Preterm AB TAB SAB Ectopic Multiple Living   0 Past Medical History  Diagnosis Date  . Heart murmur     no abx  . Anemia   . UTI (lower urinary tract infection)     Past Surgical History  Procedure Laterality Date  . No past surgeries      History reviewed. No pertinent family history.  Social History  Substance Use Topics  . Smoking status: Never Smoker   . Smokeless tobacco: Never Used  . Alcohol Use: No    Allergies: No Known Allergies  No prescriptions prior to admission    Review of Systems  Constitutional: Negative for fever and malaise/fatigue.  Gastrointestinal: Positive for nausea, vomiting and abdominal pain. Negative for diarrhea and constipation.  Genitourinary: Negative for dysuria, urgency and frequency.       Neg - vaginal bleeding, discharge   Physical Exam   Blood pressure 121/81, pulse 91, temperature 98.4 F (36.9 C), temperature source Oral, resp. rate 18, height  (1.651 m), weight 182 lb 3.2 oz (82.645 kg), last menstrual period 05/30/2015, SpO2 98 %, unknown if currently breastfeeding.  Physical Exam  Nursing note and vitals reviewed. Constitutional: She is oriented to person, place, and time. She appears well-developed and  well-nourished. No distress.  HENT:  Head: Normocephalic and atraumatic.  Cardiovascular: Normal rate.   Respiratory: Effort normal.  GI: Soft. Bowel sounds are normal. She exhibits no distension and no mass. There is no tenderness. There is no rebound and no guarding.  Genitourinary: Uterus is tender (mild). Uterus is not enlarged. Cervix exhibits no motion tenderness, no discharge and no friability. Right adnexum displays tenderness (mild). Right adnexum displays no mass. Left adnexum displays no mass and no tenderness. No bleeding in the vagina. No vaginal discharge found.  Neurological: She is alert and oriented to person, place, and time.  Skin: Skin is warm and dry. No erythema.  Psychiatric: She has a normal mood and affect.  Dilation: Closed Effacement (%): Thick Cervical Position: Posterior Exam by:: Horald Chestnut, PA-C  Results for orders placed or performed during the hospital encounter of 07/20/15 (from the past 24 hour(s))  Urinalysis, Routine w reflex microscopic (not at [facility])     Status: Abnormal   Collection Time: 07/20/15  8:03 PM  Result Value Ref Range   Color, Urine YELLOW YELLOW   APPearance HAZY (A) CLEAR   Specific Gravity, Urine 1.015 1.005 - 1.030   pH 7.0 5.0 - 8.0   Glucose, UA NEGATIVE NEGATIVE mg/dL   Hgb urine dipstick NEGATIVE NEGATIVE   Bilirubin Urine NEGATIVE NEGATIVE   Ketones, ur NEGATIVE NEGATIVE  mg/dL   Protein, ur NEGATIVE NEGATIVE mg/dL   Nitrite NEGATIVE NEGATIVE   Leukocytes, UA SMALL (A) NEGATIVE  Urine microscopic-add on     Status: Abnormal   Collection Time: 07/20/15  8:03 PM  Result Value Ref Range   Squamous Epithelial / LPF 0-5 (A) NONE SEEN   WBC, UA 6-30 0 - 5 WBC/hpf   RBC / HPF NONE SEEN 0 - 5 RBC/hpf   Bacteria, UA FEW (A) NONE SEEN  Pregnancy, urine POC     Status: Abnormal   Collection Time: 07/20/15  8:21 PM  Result Value Ref Range   Preg Test, Ur POSITIVE (A) NEGATIVE  CBC with Differential/Platelet     Status: None    Collection Time: 07/20/15  8:45 PM  Result Value Ref Range   WBC 8.1 4.0 - 10.5 K/uL   RBC 4.61 3.87 - 5.11 MIL/uL   Hemoglobin 13.2 12.0 - 15.0 g/dL   HCT 16.1 09.6 - 04.5 %   MCV 83.7 78.0 - 100.0 fL   MCH 28.6 26.0 - 34.0 pg   MCHC 34.2 30.0 - 36.0 g/dL   RDW 40.9 81.1 - 91.4 %   Platelets 316 150 - 400 K/uL   Neutrophils Relative % 70 %   Neutro Abs 5.6 1.7 - 7.7 K/uL   Lymphocytes Relative 22 %   Lymphs Abs 1.8 0.7 - 4.0 K/uL   Monocytes Relative 8 %   Monocytes Absolute 0.6 0.1 - 1.0 K/uL   Eosinophils Relative 0 %   Eosinophils Absolute 0.0 0.0 - 0.7 K/uL   Basophils Relative 0 %   Basophils Absolute 0.0 0.0 - 0.1 K/uL  hCG, quantitative, pregnancy     Status: Abnormal   Collection Time: 07/20/15  8:45 PM  Result Value Ref Range   hCG, Beta Chain, [place] [address] (H) <5 mIU/mL  Wet prep, genital     Status: Abnormal   Collection Time: 07/20/15  8:54 PM  Result Value Ref Range   Yeast Wet Prep HPF POC NONE SEEN NONE SEEN   Trich, Wet Prep PRESENT (A) NONE SEEN   Clue Cells Wet Prep HPF POC NONE SEEN NONE SEEN   WBC, Wet Prep HPF POC NONE SEEN NONE SEEN   Sperm NONE SEEN     US Ob Comp Less 14 Wks  07/20/2015  CLINICAL DATA:  Abdominal cramping since last p.m. Estimated gestational age by LMP is 7 weeks 2 days. Quantitative beta HCG is in progress. EXAM: OBSTETRIC <14 WK [place] AND TRANSVAGINAL OB US TECHNIQUE: Both transabdominal and transvaginal ultrasound examinations were performed for complete evaluation of the gestation as well as the maternal uterus, adnexal regions, and pelvic cul-de-sac. Transvaginal technique was performed to assess early pregnancy. COMPARISON:  None. FINDINGS: Intrauterine gestational sac: A single intrauterine gestational sac is identified. Yolk sac:  Yolk sac is present. Embryo:  Fetal pole is present. Cardiac Activity: Fetal cardiac activity is observed. Heart Rate: 134  bpm CRL:  10.1  mm   7 w   1 d                  [place] EDC: 02/04/2016 Subchorionic  hemorrhage: Small subchorionic hemorrhage suggested inferiorly. Maternal uterus/adnexae: Uterus is anteverted. No myometrial mass lesions identified. Both ovaries are visualized and appear normal. No abnormal adnexal masses. No free fluid in the pelvis. IMPRESSION: Single intrauterine pregnancy. Estimated gestational age by crown-rump length is 7 weeks 1 day. Small subchorionic hemorrhage suggested. Electronically Signed   By:  Burman NievesWilliam  Stevens M.D.   On: 07/20/2015 22:26   [place] Ob Transvaginal  07/20/2015  CLINICAL DATA:  Abdominal cramping since last p.m. Estimated gestational age by LMP is 7 weeks 2 days. Quantitative beta HCG is in progress. EXAM: OBSTETRIC <14 WK US AND TRANSVAGINAL OB US TECHNIQUE: Both transabdominal and transvaginal ultrasound examinations were performed for complete evaluation of the gestation as well as the maternal uterus, adnexal regions, and pelvic cul-de-sac. Transvaginal technique was performed to assess early pregnancy. COMPARISON:  None. FINDINGS: Intrauterine gestational sac: A single intrauterine gestational sac is identified. Yolk sac:  Yolk sac is present. Embryo:  Fetal pole is present. Cardiac Activity: Fetal cardiac activity is observed. Heart Rate: 134  bpm CRL:  10.1  mm   7 w   1 d                  US EDC: 02/04/2016 Subchorionic hemorrhage: Small subchorionic hemorrhage suggested inferiorly. Maternal uterus/adnexae: Uterus is anteverted. No myometrial mass lesions identified. Both ovaries are visualized and appear normal. No abnormal adnexal masses. No free fluid in the pelvis. IMPRESSION: Single intrauterine pregnancy. Estimated gestational age by crown-rump length is 7 weeks 1 day. Small subchorionic hemorrhage suggested. Electronically Signed   By: Burman NievesWilliam  Stevens M.D.   On: 07/20/2015 22:26    MAU Course  Procedures None  MDM +UPT UA, wet prep, GC/chlamydia, CBC, ABO/Rh, quant hCG, HIV, RPR and US today to rule out ectopic pregnancy IV LR with Reglan given  for N/V. Patient is driving herself today. Patient returned from US and having active vomiting. Zofran IV ordered.  2 G Flagyl given in MAU for trichomonas    Assessment and Plan  A: SIUP at 3568w1d with normal cardiac activity Abdominal pain in pregnancy Nausea and vomiting in pregnancy prior to [redacted] weeks gestation  Small subchorionic hemorrhage Trichomonas   P: Discharge home Rx for Zofran given to patient at patient's request First trimester/bleeding  precautions discussed Patient treated in MAU with Flagyl for trichomonas. Partner treatment advised Patient advised to follow-up with OB provider of choice to start prenatal care ASAP Patient may return to MAU as needed or if her condition were to change or worsen   Marny LowensteinJulie N Wenzel, PA-C  07/20/2015, 11:36 PM

## 2015-07-20 NOTE — MAU Note (Signed)
Pt c/o lower abdominal cramping that started last night and vomiting that has been on going for a few days now. Had +upt at home 2 weeks ago. LMP: 05/30/2015. Denies vaginal bleeding[facility]

## 2015-07-20 NOTE — Discharge Instructions (Signed)
Eating Plan for Hyperemesis Gravidarum °Severe cases of hyperemesis gravidarum can lead to dehydration and malnutrition. The hyperemesis eating plan is one way to lessen the symptoms of nausea and vomiting. It is often used with prescribed medicines to control your symptoms.  °WHAT CAN I DO TO RELIEVE MY SYMPTOMS? °Listen to your body. Everyone is different and has different preferences. Find what works best for you. Some of the following things may help: °· Eat and drink slowly. °· Eat 5-6 small meals daily instead of 3 large meals.   °· Eat crackers before you get out of bed in the morning.   °· Starchy foods are usually well tolerated (such as cereal, toast, bread, potatoes, pasta, rice, and pretzels).   °· Ginger may help with nausea. Add ¼ tsp ground ginger to hot tea or choose ginger tea.   °· Try drinking 100% fruit juice or an electrolyte drink. °· Continue to take your prenatal vitamins as directed by your health care provider. If you are having trouble taking your prenatal vitamins, talk with your health care provider about different options. °· Include at least 1 serving of protein with your meals and snacks (such as meats or poultry, beans, nuts, eggs, or yogurt). Try eating a protein-rich snack before bed (such as cheese and crackers or a half turkey or peanut butter sandwich). °WHAT THINGS SHOULD I AVOID TO REDUCE MY SYMPTOMS? °The following things may help reduce your symptoms: °· Avoid foods with strong smells. Try eating meals in well-ventilated areas that are free of odors. °· Avoid drinking water or other beverages with meals. Try not to drink anything less than 30 minutes before and after meals. °· Avoid drinking more than 1 cup of fluid at a time. °· Avoid fried or high-fat foods, such as butter and cream sauces. °· Avoid spicy foods. °· Avoid skipping meals the best you can. Nausea can be more intense on an empty stomach. If you cannot tolerate food at that time, do not force it. Try sucking on  ice chips or other frozen items and make up the calories later. °· Avoid lying down within 2 hours after eating. °  °This information is not intended to replace advice given to you by your health care provider. Make sure you discuss any questions you have with your health care provider. °  °Document Released: 02/12/2007 Document Revised: 04/22/2013 Document Reviewed: 02/19/2013 °Elsevier Interactive Patient Education ©2016 [employer] ° °

## 2015-07-21 LAB — HIV ANTIBODY (ROUTINE TESTING W REFLEX): HIV Screen 4th Generation wRfx: NONREACTIVE

## 2015-07-21 LAB — RPR: RPR Ser Ql: NONREACTIVE

## 2015-07-21 LAB — GC/CHLAMYDIA PROBE AMP (~~LOC~~) NOT AT ARMC
Chlamydia: NEGATIVE
Neisseria Gonorrhea: NEGATIVE

## 2016-05-24 ENCOUNTER — Encounter (HOSPITAL_COMMUNITY): Payer: Self-pay

## 2020-05-05 ENCOUNTER — Other Ambulatory Visit: Payer: No Typology Code available for payment source

## 2021-06-03 ENCOUNTER — Inpatient Hospital Stay (HOSPITAL_COMMUNITY)
Admission: AD | Admit: 2021-06-03 | Discharge: 2021-06-03 | Disposition: A | Discharge status: Home / Self Care | Payer: 59 | Attending: Obstetrics and Gynecology | Admitting: Obstetrics and Gynecology

## 2021-06-03 ENCOUNTER — Inpatient Hospital Stay (HOSPITAL_COMMUNITY): Payer: 59

## 2021-06-03 ENCOUNTER — Other Ambulatory Visit: Payer: Self-pay

## 2021-06-03 ENCOUNTER — Encounter (HOSPITAL_COMMUNITY): Payer: Self-pay | Admitting: Obstetrics and Gynecology

## 2021-06-03 DIAGNOSIS — O09521 Supervision of elderly multigravida, first trimester: Secondary | ICD-10-CM | POA: Diagnosis not present

## 2021-06-03 DIAGNOSIS — O26891 Other specified pregnancy related conditions, first trimester: Secondary | ICD-10-CM | POA: Insufficient documentation

## 2021-06-03 DIAGNOSIS — R102 Pelvic and perineal pain: Secondary | ICD-10-CM | POA: Insufficient documentation

## 2021-06-03 DIAGNOSIS — O219 Vomiting of pregnancy, unspecified: Secondary | ICD-10-CM | POA: Diagnosis not present

## 2021-06-03 DIAGNOSIS — O468X1 Other antepartum hemorrhage, first trimester: Secondary | ICD-10-CM

## 2021-06-03 DIAGNOSIS — O26851 Spotting complicating pregnancy, first trimester: Secondary | ICD-10-CM | POA: Insufficient documentation

## 2021-06-03 DIAGNOSIS — O418X10 Other specified disorders of amniotic fluid and membranes, first trimester, not applicable or unspecified: Secondary | ICD-10-CM | POA: Diagnosis not present

## 2021-06-03 DIAGNOSIS — Z3A01 Less than 8 weeks gestation of pregnancy: Secondary | ICD-10-CM | POA: Diagnosis not present

## 2021-06-03 DIAGNOSIS — O209 Hemorrhage in early pregnancy, unspecified: Secondary | ICD-10-CM

## 2021-06-03 HISTORY — DX: Acne, unspecified: L70.9

## 2021-06-03 HISTORY — DX: Headache, unspecified: R51.9

## 2021-06-03 LAB — CBC
HCT: 36.2 % (ref 36.0–46.0)
Hemoglobin: 12.2 g/dL (ref 12.0–15.0)
MCH: 28.9 pg (ref 26.0–34.0)
MCHC: 33.7 g/dL (ref 30.0–36.0)
MCV: 85.8 fL (ref 80.0–100.0)
Platelets: 314 10*3/uL (ref 150–400)
RBC: 4.22 MIL/uL (ref 3.87–5.11)
RDW: 13.6 % (ref 11.5–15.5)
WBC: 8.1 10*3/uL (ref 4.0–10.5)
nRBC: 0 % (ref 0.0–0.2)

## 2021-06-03 LAB — WET PREP, GENITAL
Sperm: NONE SEEN
Trich, Wet Prep: NONE SEEN
WBC, Wet Prep HPF POC: >=10 — AB (ref <10)
Yeast Wet Prep HPF POC: NONE SEEN

## 2021-06-03 LAB — URINALYSIS, MICROSCOPIC (REFLEX)

## 2021-06-03 LAB — URINALYSIS, ROUTINE W REFLEX MICROSCOPIC
Bilirubin Urine: NEGATIVE
Glucose, UA: NEGATIVE mg/dL
Ketones, ur: NEGATIVE mg/dL
Leukocytes,Ua: NEGATIVE
Nitrite: NEGATIVE
Protein, ur: NEGATIVE mg/dL
Specific Gravity, Urine: 1.01 (ref 1.005–1.030)
pH: 6.5 (ref 5.0–8.0)

## 2021-06-03 LAB — HCG, QUANTITATIVE, PREGNANCY: hCG, Beta Chain, Quant, S: 48813 m[IU]/mL — ABNORMAL HIGH (ref <5)

## 2021-06-03 MED ORDER — METOCLOPRAMIDE HCL 10 MG PO TABS: 10.0000 mg | ORAL_TABLET | Freq: Four times a day (QID) | ORAL | 0 refills | Status: AC

## 2021-06-03 NOTE — Discharge Instructions (Signed)
Return to care  If you have heavier bleeding that soaks through more than 2 pads per hour for an hour or more If you bleed so much that you feel like you might pass out or you do pass out If you have significant abdominal pain that is not improved with Tylenol     [place] [facility]iders          [facility]  [address], [place], [place] [address]  [phone]  [facility]  [address], [place], [place] [address]  [phone]  [facility]  [address], [place], [place] [address]  [phone]  [facility] [place]  [address], [place], [place] [address]  [phone]  [facility]  [address]), [place], [place] [address]  [phone]  [facility] [address], [place], [place] [address] [phone]  [facility]  [address], [place], [place] [address]  [phone]  [facility]  [address], [place], [place] [address]  [phone]  [facility]  [address], [place], [place] [address]  [phone]  [facility]  [address], [place], [place] [address]  [phone]  [facility]  [address], [place], [place] [address]  [phone]  [facility]  [address], [place], [place] [address]  [phone]  [facility]   [address], [place], [place] [address]  [phone]  [facility]  [address], [place], [place] [address]   [phone]  [facility]  [address], ,  [address]  [phone]         

## 2021-06-03 NOTE — MAU Provider Note (Signed)
History     CSN: [id number]  Arrival date and time: 06/03/21 1454   Event Date/Time   First Provider Initiated Contact with Patient 06/03/21 1614      Chief Complaint  Patient presents with   Vaginal Bleeding   Abdominal Pain   HPI Carolyn Halkilah Case is a 36 y.o. [id number] at 2075w4d who presents with abdominal cramping & vaginal bleeding.  Reports bleeding like a period last week. That bleeding lasted for about 3 days. Since then has had red spotting when she wipes but no longer bleeding into a pad. Also reports intermittent lower abdominal cramping for that last week that she rates pain 5/10. No aggravating or alleviating factors. Has had daily nausea & vomiting. Thought had covid due to her symptoms so went to her PCP yesterday and has a positive pregnancy test.  Denies dysuria, fever, vaginal discharge, vaginal irritation.   OB History     Gravida  4   Para  1   Term  1   Preterm  0   AB  2   Living  1      SAB      IAB  2   Ectopic      Multiple      Live Births  1           Past Medical History:  Diagnosis Date   Acne    Anemia    Headache    Heart murmur    no abx   UTI (lower urinary tract infection)     Past Surgical History:  Procedure Laterality Date   NO PAST SURGERIES      Family History  Problem Relation Age of Onset   Diabetes Mother    Healthy Father     Social History   Tobacco Use   Smoking status: Never   Smokeless tobacco: Never  Vaping Use   Vaping Use: Never used  Substance Use Topics   Alcohol use: Not Currently   Drug use: No    Allergies: No Known Allergies  Medications Prior to Admission  Medication Sig Dispense Refill Last Dose   clindamycin-benzoyl peroxide (BENZACLIN) gel Apply topically daily. acne   06/02/2021   ondansetron (ZOFRAN ODT) 4 MG disintegrating tablet Take 1 tablet (4 mg total) by mouth every 6 (six) hours as needed for nausea. 20 tablet 3 06/03/2021   tretinoin (RETIN-A) 0.1 % cream Apply  topically at bedtime.   06/02/2021   dicyclomine (BENTYL) 20 MG tablet Take 20 mg by mouth every 6 (six) hours.      predniSONE (DELTASONE) 20 MG tablet Take 40 mg by mouth daily.       Review of Systems  Constitutional: Negative.   Gastrointestinal:  Positive for abdominal pain, diarrhea, nausea and vomiting. Negative for constipation.  Genitourinary:  Positive for vaginal bleeding. Negative for dysuria and vaginal discharge.  Physical Exam   Blood pressure (!) 116/57, pulse 90, temperature 98.3 F (36.8 C), temperature source Oral, resp. rate 16, height 5\' 4"  (1.626 m), weight 90.9 kg, last menstrual period 04/18/2021, SpO2 100 %, unknown if currently breastfeeding.  Physical Exam Vitals and nursing note reviewed. Exam conducted with a chaperone present.  Constitutional:      General: She is not in acute distress.    Appearance: She is well-developed.  HENT:     Head: Normocephalic and atraumatic.  Pulmonary:     Effort: Pulmonary effort is normal. No respiratory distress.  Abdominal:     General:  Abdomen is flat.     Palpations: Abdomen is soft.     Tenderness: There is no abdominal tenderness.  Genitourinary:    Comments: NEFG. Scant dark red blood.  Skin:    General: Skin is warm and dry.  Neurological:     General: No focal deficit present.     Mental Status: She is alert.  Psychiatric:        Mood and Affect: Mood normal.        Behavior: Behavior normal.    MAU Course  Procedures Results for orders placed or performed during the hospital encounter of 06/03/21 (from the past 24 hour(s))  Urinalysis, Routine w reflex microscopic Urine, Clean Catch     Status: Abnormal   Collection Time: 06/03/21  4:07 PM  Result Value Ref Range   Color, Urine YELLOW YELLOW   APPearance CLEAR CLEAR   Specific Gravity, Urine 1.010 1.005 - 1.030   pH 6.5 5.0 - 8.0   Glucose, UA NEGATIVE NEGATIVE mg/dL   Hgb urine dipstick MODERATE (A) NEGATIVE   Bilirubin Urine NEGATIVE NEGATIVE    Ketones, ur NEGATIVE NEGATIVE mg/dL   Protein, ur NEGATIVE NEGATIVE mg/dL   Nitrite NEGATIVE NEGATIVE   Leukocytes,Ua NEGATIVE NEGATIVE  Urinalysis, Microscopic (reflex)     Status: Abnormal   Collection Time: 06/03/21  4:07 PM  Result Value Ref Range   RBC / HPF 0-5 0 - 5 RBC/hpf   WBC, UA 0-5 0 - 5 WBC/hpf   Bacteria, UA RARE (A) NONE SEEN   Squamous Epithelial / LPF 0-5 0 - 5   Mucus PRESENT   CBC     Status: None   Collection Time: 06/03/21  4:35 PM  Result Value Ref Range   WBC 8.1 4.0 - 10.5 K/uL   RBC 4.22 3.87 - 5.11 MIL/uL   Hemoglobin 12.2 12.0 - 15.0 g/dL   HCT 85.0 27.7 - 41.2 %   MCV 85.8 80.0 - 100.0 fL   MCH 28.9 26.0 - 34.0 pg   MCHC 33.7 30.0 - 36.0 g/dL   RDW 87.8 67.6 - 72.0 %   Platelets 314 150 - 400 K/uL   nRBC 0.0 0.0 - 0.2 %  hCG, quantitative, pregnancy     Status: Abnormal   Collection Time: 06/03/21  4:35 PM  Result Value Ref Range   hCG, Beta Chain, Quant, S 48,813 (H) <5 mIU/mL  Wet prep, genital     Status: Abnormal   Collection Time: 06/03/21  4:39 PM  Result Value Ref Range   Yeast Wet Prep HPF POC NONE SEEN NONE SEEN   Trich, Wet Prep NONE SEEN NONE SEEN   Clue Cells Wet Prep HPF POC PRESENT (A) NONE SEEN   WBC, Wet Prep HPF POC >=10 (A) <10   Sperm NONE SEEN    US OB LESS THAN 14 WEEKS WITH OB TRANSVAGINAL  Result Date: 06/03/2021 CLINICAL DATA:  Bleeding for 1 week, pain for 1 day. EXAM: OBSTETRIC <14 WK [place] AND TRANSVAGINAL OB US TECHNIQUE: Both transabdominal and transvaginal ultrasound examinations were performed for complete evaluation of the gestation as well as the maternal uterus, adnexal regions, and pelvic cul-de-sac. Transvaginal technique was performed to assess early pregnancy. COMPARISON:  None. FINDINGS: Intrauterine gestational sac: Single.  Chorionic bump demonstrated. Yolk sac:  Visualized. Embryo:  Visualized. Cardiac Activity: Visualized. Heart Rate: 119 bpm MSD:   mm    w     d CRL:  5.8 mm   6 w  2 d                  [place]  EDC: 01/25/2022 Subchorionic hemorrhage:  Large Maternal uterus/adnexae: Maternal ovaries appear normal and there is no mass or free fluid identified within either adnexal region. IMPRESSION: 1. Single live intrauterine pregnancy with estimated gestational age of [redacted] weeks and 2 days by crown-rump length measurement. 2. Large subchorionic hemorrhage, measuring 4.7 x 1.9 x 2 cm. This subchorionic hemorrhage is immediately adjacent to the gestational sac. 3. Chorionic bump noted at the gestational sac, of uncertain significance. Electronically Signed   By: Bary Richard M.D.   On: 06/03/2021 17:53    MDM +UPT UA, wet prep, GC/chlamydia, CBC, ABO/Rh, quant hCG, and [place] today to rule out ectopic pregnancy which can be life threatening.   Scant bleeding in MAU. RH positive Ultrasound shows live IUP with moderate subchorionic hemorrhage. Discussed results with patient, including increased risk for miscarriage & gave return precautions.   Patient would like antiemetic - will rx reglan.   Assessment and Plan   1. Subchorionic hematoma in first trimester, single or unspecified fetus  -discussed bleeding precautions & reasons to return to MAU  2. Vaginal bleeding in pregnancy, first trimester   3. Nausea and vomiting during pregnancy  -rx reglan  4. [redacted] weeks gestation of pregnancy  -start prenatal care -given list of obs     Judeth Horn 06/03/2021, 6:20 PM

## 2021-06-03 NOTE — MAU Note (Signed)
Has been bleeding since last Tues, thought it was her period.  Was heavy Thu, Fri and Sat. Then it slowed down, still seeing red when she wipes. +Preg test at [facility] today, sent for further eval.  Little bit of cramping.

## 2021-06-06 LAB — GC/CHLAMYDIA PROBE AMP (~~LOC~~) NOT AT ARMC
Chlamydia: NEGATIVE
Comment: NEGATIVE
Comment: NORMAL
Neisseria Gonorrhea: NEGATIVE
